# Patient Record
Sex: Male | Born: 1945
Health system: Southern US, Community
[De-identification: ages and names within clinical notes are randomized; demographics above are authoritative.]

## PROBLEM LIST (undated history)

## (undated) DIAGNOSIS — D519 Vitamin B12 deficiency anemia, unspecified: Secondary | ICD-10-CM

## (undated) DIAGNOSIS — Z803 Family history of malignant neoplasm of breast: Secondary | ICD-10-CM

## (undated) DIAGNOSIS — E785 Hyperlipidemia, unspecified: Secondary | ICD-10-CM

## (undated) DIAGNOSIS — H9191 Unspecified hearing loss, right ear: Secondary | ICD-10-CM

## (undated) DIAGNOSIS — I10 Essential (primary) hypertension: Secondary | ICD-10-CM

## (undated) DIAGNOSIS — H9192 Unspecified hearing loss, left ear: Secondary | ICD-10-CM

## (undated) DIAGNOSIS — K5790 Diverticulosis of intestine, part unspecified, without perforation or abscess without bleeding: Secondary | ICD-10-CM

## (undated) HISTORY — DX: Unspecified hearing loss, left ear: H91.92

## (undated) HISTORY — DX: Essential (primary) hypertension: I10

## (undated) HISTORY — PX: COCHLEAR IMPLANT: SUR684

## (undated) HISTORY — DX: Diverticulosis of intestine, part unspecified, without perforation or abscess without bleeding: K57.90

## (undated) HISTORY — PX: COLONOSCOPY: SHX174

## (undated) HISTORY — PX: KIDNEY STONE SURGERY: SHX686

## (undated) HISTORY — DX: Unspecified hearing loss, right ear: H91.91

## (undated) HISTORY — PX: OTHER SURGICAL HISTORY: SHX169

## (undated) HISTORY — DX: Vitamin B12 deficiency anemia, unspecified: D51.9

## (undated) HISTORY — DX: Hyperlipidemia, unspecified: E78.5

## (undated) HISTORY — DX: Family history of malignant neoplasm of breast: Z80.3

---

## 2004-05-12 ENCOUNTER — Encounter: Admission: RE | Admit: 2004-05-12 | Discharge: 2004-05-12 | Payer: Self-pay | Admitting: Internal Medicine

## 2004-10-21 ENCOUNTER — Ambulatory Visit: Payer: Self-pay | Admitting: Internal Medicine

## 2004-10-28 ENCOUNTER — Ambulatory Visit: Payer: Self-pay | Admitting: Internal Medicine

## 2004-11-04 LAB — HM COLONOSCOPY

## 2005-02-10 ENCOUNTER — Ambulatory Visit: Payer: Self-pay | Admitting: Internal Medicine

## 2005-02-17 ENCOUNTER — Ambulatory Visit: Payer: Self-pay | Admitting: Internal Medicine

## 2005-05-28 ENCOUNTER — Ambulatory Visit: Payer: Self-pay | Admitting: Internal Medicine

## 2005-06-04 ENCOUNTER — Ambulatory Visit: Payer: Self-pay | Admitting: Internal Medicine

## 2005-09-01 ENCOUNTER — Ambulatory Visit: Payer: Self-pay | Admitting: Internal Medicine

## 2005-11-26 ENCOUNTER — Ambulatory Visit: Payer: Self-pay | Admitting: Internal Medicine

## 2005-12-03 ENCOUNTER — Ambulatory Visit: Payer: Self-pay | Admitting: Internal Medicine

## 2006-05-27 ENCOUNTER — Ambulatory Visit: Payer: Self-pay | Admitting: Internal Medicine

## 2006-06-15 ENCOUNTER — Ambulatory Visit: Payer: Self-pay | Admitting: Internal Medicine

## 2007-05-03 DIAGNOSIS — E785 Hyperlipidemia, unspecified: Secondary | ICD-10-CM | POA: Insufficient documentation

## 2007-06-27 ENCOUNTER — Ambulatory Visit: Payer: Self-pay | Admitting: Internal Medicine

## 2007-06-27 LAB — CONVERTED CEMR LAB
Alkaline Phosphatase: 71 units/L (ref 39–117)
Basophils Relative: 0.4 % (ref 0.0–1.0)
Calcium: 9.7 mg/dL (ref 8.4–10.5)
Chloride: 107 meq/L (ref 96–112)
Cholesterol: 135 mg/dL (ref 0–200)
Eosinophils Absolute: 0.4 10*3/uL (ref 0.0–0.6)
Eosinophils Relative: 6 % — ABNORMAL HIGH (ref 0.0–5.0)
Glucose, Bld: 99 mg/dL (ref 70–99)
LDL Cholesterol: 79 mg/dL (ref 0–99)
Lymphocytes Relative: 19.2 % (ref 12.0–46.0)
Monocytes Absolute: 0.7 10*3/uL (ref 0.2–0.7)
Monocytes Relative: 9.4 % (ref 3.0–11.0)
Neutro Abs: 5 10*3/uL (ref 1.4–7.7)
Platelets: 210 10*3/uL (ref 150–400)
Potassium: 4.7 meq/L (ref 3.5–5.1)
Protein, U semiquant: NEGATIVE
RDW: 12.5 % (ref 11.5–14.6)
Total Bilirubin: 0.9 mg/dL (ref 0.3–1.2)
Total CHOL/HDL Ratio: 4.6

## 2007-07-20 ENCOUNTER — Ambulatory Visit: Payer: Self-pay | Admitting: Internal Medicine

## 2007-07-20 DIAGNOSIS — K5730 Diverticulosis of large intestine without perforation or abscess without bleeding: Secondary | ICD-10-CM | POA: Insufficient documentation

## 2007-07-20 LAB — CONVERTED CEMR LAB
Cholesterol, target level: 200 mg/dL
LDL Goal: 130 mg/dL

## 2007-07-28 ENCOUNTER — Telehealth: Payer: Self-pay | Admitting: *Deleted

## 2008-01-16 ENCOUNTER — Ambulatory Visit: Payer: Self-pay | Admitting: Internal Medicine

## 2008-01-16 LAB — CONVERTED CEMR LAB
Albumin: 3.9 g/dL (ref 3.5–5.2)
Alkaline Phosphatase: 61 units/L (ref 39–117)
Bilirubin, Direct: 0.1 mg/dL (ref 0.0–0.3)
HDL: 31.8 mg/dL — ABNORMAL LOW (ref >39.0)
LDL Cholesterol: 96 mg/dL (ref 0–99)
Total Bilirubin: 0.9 mg/dL (ref 0.3–1.2)
Total CHOL/HDL Ratio: 4.9
Total Protein: 6.5 g/dL (ref 6.0–8.3)

## 2008-01-25 ENCOUNTER — Ambulatory Visit: Payer: Self-pay | Admitting: Internal Medicine

## 2008-01-25 DIAGNOSIS — I10 Essential (primary) hypertension: Secondary | ICD-10-CM | POA: Insufficient documentation

## 2008-03-28 ENCOUNTER — Ambulatory Visit: Payer: Self-pay | Admitting: Internal Medicine

## 2008-03-28 LAB — CONVERTED CEMR LAB
CRP, High Sensitivity: <1 — ABNORMAL LOW (ref 0.00–5.00)
Calcium: 9.6 mg/dL (ref 8.4–10.5)
Chloride: 106 meq/L (ref 96–112)
Glucose, Bld: 97 mg/dL (ref 70–99)
Potassium: 4.4 meq/L (ref 3.5–5.1)
Sodium: 142 meq/L (ref 135–145)

## 2008-04-26 ENCOUNTER — Ambulatory Visit: Payer: Self-pay | Admitting: Internal Medicine

## 2008-07-25 ENCOUNTER — Ambulatory Visit: Payer: Self-pay | Admitting: Internal Medicine

## 2008-07-25 LAB — CONVERTED CEMR LAB
ALT: 24 units/L (ref 0–53)
Alkaline Phosphatase: 66 units/L (ref 39–117)
BUN: 14 mg/dL (ref 6–23)
Calcium: 9.5 mg/dL (ref 8.4–10.5)
Chloride: 108 meq/L (ref 96–112)
Cholesterol: 130 mg/dL (ref 0–200)
Eosinophils Absolute: 0.6 10*3/uL (ref 0.0–0.7)
Eosinophils Relative: 8.6 % — ABNORMAL HIGH (ref 0.0–5.0)
GFR calc Af Amer: 66 mL/min
GFR calc non Af Amer: 55 mL/min
Glucose, Urine, Semiquant: NEGATIVE
HCT: 39.9 % (ref 39.0–52.0)
HDL: 33.9 mg/dL — ABNORMAL LOW (ref >39.0)
Hemoglobin: 14 g/dL (ref 13.0–17.0)
LDL Cholesterol: 66 mg/dL (ref 0–99)
MCHC: 35.1 g/dL (ref 30.0–36.0)
Monocytes Absolute: 0.4 10*3/uL (ref 0.1–1.0)
Neutro Abs: 4 10*3/uL (ref 1.4–7.7)
Platelets: 204 10*3/uL (ref 150–400)
Specific Gravity, Urine: 1.025
Urobilinogen, UA: 0.2
VLDL: 30 mg/dL (ref 0–40)
WBC Urine, dipstick: NEGATIVE
pH: 5

## 2008-08-01 ENCOUNTER — Ambulatory Visit: Payer: Self-pay | Admitting: Internal Medicine

## 2008-11-01 ENCOUNTER — Ambulatory Visit: Payer: Self-pay | Admitting: Internal Medicine

## 2008-11-01 DIAGNOSIS — G63 Polyneuropathy in diseases classified elsewhere: Secondary | ICD-10-CM

## 2008-11-01 DIAGNOSIS — E538 Deficiency of other specified B group vitamins: Secondary | ICD-10-CM | POA: Insufficient documentation

## 2008-12-04 ENCOUNTER — Telehealth: Payer: Self-pay | Admitting: Internal Medicine

## 2009-07-31 ENCOUNTER — Ambulatory Visit: Payer: Self-pay | Admitting: Internal Medicine

## 2009-07-31 LAB — CONVERTED CEMR LAB
ALT: 26 units/L (ref 0–53)
Albumin: 4 g/dL (ref 3.5–5.2)
Basophils Absolute: 0.1 10*3/uL (ref 0.0–0.1)
Basophils Relative: 0.8 % (ref 0.0–3.0)
Bilirubin Urine: NEGATIVE
Bilirubin, Direct: 0.1 mg/dL (ref 0.0–0.3)
Eosinophils Absolute: 0.4 10*3/uL (ref 0.0–0.7)
Eosinophils Relative: 4 % (ref 0.0–5.0)
GFR calc non Af Amer: 59.37 mL/min (ref >60)
Glucose, Bld: 103 mg/dL — ABNORMAL HIGH (ref 70–99)
Glucose, Urine, Semiquant: NEGATIVE
HDL: 33.3 mg/dL — ABNORMAL LOW (ref >39.00)
Ketones, urine, test strip: NEGATIVE
Lymphocytes Relative: 19.8 % (ref 12.0–46.0)
Lymphs Abs: 1.9 10*3/uL (ref 0.7–4.0)
Neutro Abs: 6.9 10*3/uL (ref 1.4–7.7)
PSA: 0.88 ng/mL (ref 0.10–4.00)
Sodium: 144 meq/L (ref 135–145)
Total Protein: 6.9 g/dL (ref 6.0–8.3)
Triglycerides: 242 mg/dL — ABNORMAL HIGH (ref 0.0–149.0)
Urobilinogen, UA: 0.2
VLDL: 48.4 mg/dL — ABNORMAL HIGH (ref 0.0–40.0)
WBC Urine, dipstick: NEGATIVE
WBC: 9.8 10*3/uL (ref 4.5–10.5)
pH: 5.5

## 2009-08-07 ENCOUNTER — Ambulatory Visit: Payer: Self-pay | Admitting: Internal Medicine

## 2009-09-15 ENCOUNTER — Telehealth: Payer: Self-pay | Admitting: Internal Medicine

## 2009-12-26 ENCOUNTER — Telehealth: Payer: Self-pay | Admitting: Internal Medicine

## 2010-01-29 ENCOUNTER — Ambulatory Visit: Payer: Self-pay | Admitting: Internal Medicine

## 2010-01-29 LAB — CONVERTED CEMR LAB
AST: 19 units/L (ref 0–37)
Bilirubin, Direct: 0.1 mg/dL (ref 0.0–0.3)
Cholesterol: 200 mg/dL (ref 0–200)
Total Bilirubin: 0.5 mg/dL (ref 0.3–1.2)
Triglycerides: 877 mg/dL — ABNORMAL HIGH (ref 0.0–149.0)
VLDL: 175.4 mg/dL — ABNORMAL HIGH (ref 0.0–40.0)

## 2010-02-05 ENCOUNTER — Ambulatory Visit: Payer: Self-pay | Admitting: Internal Medicine

## 2010-03-25 ENCOUNTER — Emergency Department (HOSPITAL_COMMUNITY): Admission: EM | Admit: 2010-03-25 | Discharge: 2010-03-25 | Payer: Self-pay | Admitting: Emergency Medicine

## 2010-04-23 ENCOUNTER — Ambulatory Visit: Payer: Self-pay | Admitting: Internal Medicine

## 2010-04-23 LAB — CONVERTED CEMR LAB
ALT: 22 units/L (ref 0–53)
Albumin: 3.9 g/dL (ref 3.5–5.2)
Bilirubin, Direct: 0.2 mg/dL (ref 0.0–0.3)
HDL: 36.9 mg/dL — ABNORMAL LOW (ref >39.00)
LDL Cholesterol: 69 mg/dL (ref 0–99)
Total CHOL/HDL Ratio: 4

## 2010-04-30 ENCOUNTER — Ambulatory Visit: Payer: Self-pay | Admitting: Internal Medicine

## 2010-04-30 DIAGNOSIS — N200 Calculus of kidney: Secondary | ICD-10-CM | POA: Insufficient documentation

## 2010-05-05 ENCOUNTER — Encounter: Payer: Self-pay | Admitting: Internal Medicine

## 2010-07-16 ENCOUNTER — Encounter: Payer: Self-pay | Admitting: Internal Medicine

## 2010-07-16 ENCOUNTER — Ambulatory Visit (HOSPITAL_BASED_OUTPATIENT_CLINIC_OR_DEPARTMENT_OTHER): Admission: RE | Admit: 2010-07-16 | Discharge: 2010-07-16 | Payer: Self-pay | Admitting: Internal Medicine

## 2010-07-22 ENCOUNTER — Ambulatory Visit: Payer: Self-pay | Admitting: Pulmonary Disease

## 2010-08-05 ENCOUNTER — Ambulatory Visit: Payer: Self-pay | Admitting: Internal Medicine

## 2010-08-05 LAB — CONVERTED CEMR LAB
ALT: 24 units/L (ref 0–53)
AST: 25 units/L (ref 0–37)
Albumin: 4.3 g/dL (ref 3.5–5.2)
Basophils Relative: 0.6 % (ref 0.0–3.0)
Bilirubin Urine: NEGATIVE
Bilirubin, Direct: 0.1 mg/dL (ref 0.0–0.3)
CO2: 26 meq/L (ref 19–32)
Calcium: 9.7 mg/dL (ref 8.4–10.5)
Creatinine, Ser: 1.5 mg/dL (ref 0.4–1.5)
Eosinophils Absolute: 0.6 10*3/uL (ref 0.0–0.7)
GFR calc non Af Amer: 50.17 mL/min (ref >60)
Hemoglobin: 13.8 g/dL (ref 13.0–17.0)
MCV: 87.4 fL (ref 78.0–100.0)
Monocytes Absolute: 0.4 10*3/uL (ref 0.1–1.0)
Monocytes Relative: 5.4 % (ref 3.0–12.0)
Nitrite: NEGATIVE
Platelets: 209 10*3/uL (ref 150.0–400.0)
Potassium: 4.7 meq/L (ref 3.5–5.1)
RBC: 4.62 M/uL (ref 4.22–5.81)
Sodium: 141 meq/L (ref 135–145)
Specific Gravity, Urine: 1.025 (ref 1.000–1.030)
Total Protein, Urine: NEGATIVE mg/dL
Triglycerides: 196 mg/dL — ABNORMAL HIGH (ref 0.0–149.0)
WBC: 7.9 10*3/uL (ref 4.5–10.5)
pH: 5.5 (ref 5.0–8.0)

## 2010-08-12 ENCOUNTER — Ambulatory Visit: Payer: Self-pay | Admitting: Internal Medicine

## 2010-10-21 NOTE — Progress Notes (Signed)
Summary: folbic refill  Phone Note Refill Request Message from:  Fax from Pharmacy on December 26, 2009 3:31 PM  Refills Requested: Medication #1:  FOLBIC 2.5-25-2 MG TABS ONE by mouth DAILY Initial call taken by: Kern Reap CMA Duncan Dull),  December 26, 2009 3:31 PM    Prescriptions: FOLBIC 2.5-25-2 MG TABS (FA-PYRIDOXINE-CYANCOBALAMIN) ONE by mouth DAILY  #90 x 3   Entered by:   Kern Reap CMA (AAMA)   Authorized by:   Stacie Glaze MD   Signed by:   Kern Reap CMA (AAMA) on 12/26/2009   Method used:   Electronically to        [facility]* (mail-order)             ,          Ph: [phone]       Fax: [phone]   RxID:   [id number]

## 2010-10-21 NOTE — Letter (Signed)
Summary: Eye Exam/[facility]  [facility]   Imported By: Maryln Gottron 05/14/2010 09:44:09  _____________________________________________________________________  External Attachment:    Type:   Image     Comment:   External Document

## 2010-10-21 NOTE — Assessment & Plan Note (Signed)
Summary: 2 month fup//ccm/pt rsc/cjr/pt rsc/cjr   Vital Signs:  Patient profile:   65 year old male Height:      69 inches Weight:      152 pounds BMI:     22.53 Temp:     98.2 degrees F oral Pulse rate:   68 / minute Resp:     14 per minute BP sitting:   124 / 74  (left arm)  Vitals Entered By: Willy Eddy, LPN (April 30, 2010 12:44 PM) CC: roa labs, Lipid Management, Hypertension Management Is Patient Diabetic? No   Primary Care Osamu Olguin:  Darryll Capers MD  CC:  roa labs, Lipid Management, and Hypertension Management.  History of Present Illness: follw up of lipids recent hx of colonoscopy and hx of rencent impacted renal stone with removal   Hypertension History:      He denies headache, chest pain, palpitations, dyspnea with exertion, orthopnea, PND, peripheral edema, visual symptoms, neurologic problems, syncope, and side effects from treatment.        Positive major cardiovascular risk factors include male age 20 years old or older, hyperlipidemia, and hypertension.  Negative major cardiovascular risk factors include no history of diabetes, negative family history for ischemic heart disease, and non-tobacco-user status.        Further assessment for target organ damage reveals no history of ASHD, stroke/TIA, or peripheral vascular disease.    Lipid Management History:      Positive NCEP/ATP III risk factors include male age 11 years old or older, HDL cholesterol less than 40, and hypertension.  Negative NCEP/ATP III risk factors include non-diabetic, no family history for ischemic heart disease, non-tobacco-user status, no ASHD (atherosclerotic heart disease), no prior stroke/TIA, no peripheral vascular disease, and no history of aortic aneurysm.      Preventive Screening-Counseling & Management  Alcohol-Tobacco     Alcohol drinks/day: <1     Smoking Status: never     Passive Smoke Exposure: no  Current Problems (verified): 1)  Acute Bronchitis   (ICD-466.0) 2)  Idiopathic Peripheral Autonomic Neuropathy Unsp  (ICD-337.00) 3)  Hypertension  (ICD-401.9) 4)  Well Adult  (ICD-V70.0) 5)  Diverticulosis, Colon  (ICD-562.10) 6)  Hyperlipidemia  (ICD-272.4) 7)  Family History Diabetes 1st Degree Relative  (ICD-V18.0) 8)  Family History Breast Cancer 1st Degree Relative <50  (ICD-V16.3)  Current Medications (verified): 1)  Adult Aspirin Low Strength 81 Mg  Tbdp (Aspirin) .... Once Daily 2)  Zyrtec Allergy 10 Mg  Tabs (Cetirizine Hcl) .... Once Daily 3)  Vytorin 10-40 Mg Tabs (Ezetimibe-Simvastatin) 4)  Losartan Potassium 100 Mg Tabs (Losartan Potassium) .... One By Mouth Daily 5)  Folbic 2.5-25-2 Mg Tabs (Fa-Pyridoxine-Cyancobalamin) .... One By Mouth Daily 6)  Niacin Flush Free 500 Mg Caps (Inositol Niacinate) .... One By Mouth Daily 7)  Vitamin D 3 1000 .Marland Kitchen.. 1 Once Daily  Allergies (verified): No Known Drug Allergies  Past History:  Family History: Last updated: 03/28/2008 Family History of Emphysema Family History Breast cancer 1st degree relative <50 Family History Diabetes 1st degree relative Family History of Cardiovascular disorder Family History Hypertension  Social History: Last updated: 05/03/2007 Occupation: Married Never Smoked Alcohol use-yes Drug use-no  Risk Factors: Alcohol Use: <1 (04/30/2010) Exercise: yes (07/20/2007)  Risk Factors: Smoking Status: never (04/30/2010) Passive Smoke Exposure: no (04/30/2010)  Past medical, surgical, family and social histories (including risk factors) reviewed, and no changes noted (except as noted below).  Past Medical History: Reviewed history from 11/01/2008 and no changes  required. Urolithiasis Hyperlipidemia Diverticulosis, colon right hearing loss Hypertension b12 anemia  Past Surgical History: Reviewed history from 05/03/2007 and no changes required. Colonoscopy  Family History: Reviewed history from 03/28/2008 and no changes required. Family  History of Emphysema Family History Breast cancer 1st degree relative <50 Family History Diabetes 1st degree relative Family History of Cardiovascular disorder Family History Hypertension  Social History: Reviewed history from 05/03/2007 and no changes required. Occupation: Married Never Smoked Alcohol use-yes Drug use-no  Review of Systems  The patient denies anorexia, fever, weight loss, weight gain, vision loss, decreased hearing, hoarseness, chest pain, syncope, dyspnea on exertion, peripheral edema, prolonged cough, headaches, hemoptysis, abdominal pain, melena, hematochezia, severe indigestion/heartburn, hematuria, incontinence, genital sores, muscle weakness, suspicious skin lesions, transient blindness, difficulty walking, depression, unusual weight change, abnormal bleeding, enlarged lymph nodes, angioedema, and breast masses.     Impression & Recommendations:  Problem # 1:  DIVERTICULOSIS, COLON (ICD-562.10) Assessment Unchanged  had colon on June 30  Charm Barges) results pending had pain afterward  Colonoscopy:  Labs Reviewed: Hgb: 14.1 (07/31/2009)   Hct: 41.3 (07/31/2009)   WBC: 9.8 (07/31/2009)  Problem # 2:  NEPHROLITHIASIS, RECURRENT (ICD-592.0) Assessment: Unchanged had a stone ( possible dehadration after the colonoscopy and the heat) had surgical removal [place] at [facility] increased fluids treated for infection  Problem # 3:  HYPERTENSION (ICD-401.9)  His updated medication list for this problem includes:    Losartan Potassium 100 Mg Tabs (Losartan potassium) ..... One by mouth daily  BP today: 124/74 Prior BP: 116/78 (02/05/2010)  10 Yr Risk Heart Disease: 7 % Prior 10 Yr Risk Heart Disease: 9 % (02/05/2010)  Labs Reviewed: K+: 4.2 (07/31/2009) Creat: : 1.3 (07/31/2009)   Chol: 142 (04/23/2010)   HDL: 36.90 (04/23/2010)   LDL: 69 (04/23/2010)   TG: 182.0 (04/23/2010)  Problem # 4:  HYPERLIPIDEMIA (ICD-272.4) stable results ans inproved  triglyceride levels  His updated medication list for this problem includes:    Vytorin 10-40 Mg Tabs (Ezetimibe-simvastatin) ..... One by mouth daily  Labs Reviewed: SGOT: 21 (04/23/2010)   SGPT: 22 (04/23/2010)  Lipid Goals: Chol Goal: 200 (07/20/2007)   HDL Goal: 40 (07/20/2007)   LDL Goal: 130 (07/20/2007)   TG Goal: 150 (07/20/2007)  10 Yr Risk Heart Disease: 7 % Prior 10 Yr Risk Heart Disease: 9 % (02/05/2010)   HDL:36.90 (04/23/2010), 34.40 (01/29/2010)  LDL:69 (04/23/2010), 66 (07/25/2008)  Chol:142 (04/23/2010), 200 (01/29/2010)  Trig:182.0 (04/23/2010), 877.0 (01/29/2010)  Complete Medication List: 1)  Adult Aspirin Low Strength 81 Mg Tbdp (Aspirin) .... Once daily 2)  Zyrtec Allergy 10 Mg Tabs (Cetirizine hcl) .... Once daily 3)  Vytorin 10-40 Mg Tabs (Ezetimibe-simvastatin) .... One by mouth daily 4)  Losartan Potassium 100 Mg Tabs (Losartan potassium) .... One by mouth daily 5)  Folbic 2.5-25-2 Mg Tabs (Fa-pyridoxine-cyancobalamin) .... One by mouth daily 6)  Niacin Flush Free 500 Mg Caps (Inositol niacinate) .... One by mouth daily 7)  Vitamin D 3 1000  .Marland Kitchen.. 1 once daily  Hypertension Assessment/Plan:      The patient's hypertensive risk group is category B: At least one risk factor (excluding diabetes) with no target organ damage.  His calculated 10 year risk of coronary heart disease is 7 %.  Today's blood pressure is 124/74.  His blood pressure goal is < 140/90.  Lipid Assessment/Plan:      Based on NCEP/ATP III, the patient's risk factor category is "2 or more risk factors and a calculated 10 year CAD risk  of < 20%".  The patient's lipid goals are as follows: Total cholesterol goal is 200; LDL cholesterol goal is 130; HDL cholesterol goal is 40; Triglyceride goal is 150.  His LDL cholesterol goal has been met.    Patient Instructions: 1)  Please schedule a follow-up appointment in 2months. CPX Prescriptions: VYTORIN 10-40 MG TABS (EZETIMIBE-SIMVASTATIN) one by mouth  daily  #90 x 3   Entered and Authorized by:   Stacie Glaze MD   Signed by:   Stacie Glaze MD on 04/30/2010   Method used:   Electronically to        [facility]* (retail)             ,          Ph: [phone]       Fax: [phone]   RxID:   [id number]   Appended Document: Orders Update    Clinical Lists Changes  Problems: Added new problem of SLEEP APNEA (ICD-780.57) Orders: Added new Referral order of Sleep Disorder Referral (Sleep Disorder) - Signed

## 2010-10-21 NOTE — Assessment & Plan Note (Signed)
Summary: cpx/njr   Vital Signs:  Patient profile:   65 year old male Height:      69 inches Weight:      156 pounds BMI:     23.12 Temp:     98.2 degrees F oral Pulse rate:   68 / minute Resp:     14 per minute BP sitting:   118 / 72  (left arm)  Vitals Entered By: Willy Eddy, LPN (August 12, 2010 11:04 AM) CC: cpx, Hypertension Management, Lipid Management Is Patient Diabetic? No   Primary Care Arvid Marengo:  Darryll Capers MD  CC:  cpx, Hypertension Management, and Lipid Management.  History of Present Illness: The pt was asked about all immunizations, health maint. services that are appropriate to their age and was given guidance on diet exercize  and weight management  the pt has HTN well controlled and lipids with elevated triglycerides  Hypertension History:      He denies headache, chest pain, palpitations, dyspnea with exertion, orthopnea, PND, peripheral edema, visual symptoms, neurologic problems, syncope, and side effects from treatment.        Positive major cardiovascular risk factors include male age 72 years old or older, hyperlipidemia, and hypertension.  Negative major cardiovascular risk factors include no history of diabetes, negative family history for ischemic heart disease, and non-tobacco-user status.        Further assessment for target organ damage reveals no history of ASHD, stroke/TIA, or peripheral vascular disease.    Lipid Management History:      Positive NCEP/ATP III risk factors include male age 18 years old or older, HDL cholesterol less than 40, and hypertension.  Negative NCEP/ATP III risk factors include non-diabetic, no family history for ischemic heart disease, non-tobacco-user status, no ASHD (atherosclerotic heart disease), no prior stroke/TIA, no peripheral vascular disease, and no history of aortic aneurysm.      Preventive Screening-Counseling & Management  Alcohol-Tobacco     Alcohol drinks/day: <1     Smoking Status: never   Passive Smoke Exposure: no     Tobacco Counseling: not indicated; no tobacco use  Problems Prior to Update: 1)  Sleep Apnea  (ICD-780.57) 2)  Nephrolithiasis, Recurrent  (ICD-592.0) 3)  Acute Bronchitis  (ICD-466.0) 4)  Idiopathic Peripheral Autonomic Neuropathy Unsp  (ICD-337.00) 5)  Hypertension  (ICD-401.9) 6)  Well Adult  (ICD-V70.0) 7)  Diverticulosis, Colon  (ICD-562.10) 8)  Hyperlipidemia  (ICD-272.4) 9)  Family History Diabetes 1st Degree Relative  (ICD-V18.0) 10)  Family History Breast Cancer 1st Degree Relative <50  (ICD-V16.3)  Current Problems (verified): 1)  Sleep Apnea  (ICD-780.57) 2)  Nephrolithiasis, Recurrent  (ICD-592.0) 3)  Acute Bronchitis  (ICD-466.0) 4)  Idiopathic Peripheral Autonomic Neuropathy Unsp  (ICD-337.00) 5)  Hypertension  (ICD-401.9) 6)  Well Adult  (ICD-V70.0) 7)  Diverticulosis, Colon  (ICD-562.10) 8)  Hyperlipidemia  (ICD-272.4) 9)  Family History Diabetes 1st Degree Relative  (ICD-V18.0) 10)  Family History Breast Cancer 1st Degree Relative <50  (ICD-V16.3)  Medications Prior to Update: 1)  Adult Aspirin Low Strength 81 Mg  Tbdp (Aspirin) .... Once Daily 2)  Zyrtec Allergy 10 Mg  Tabs (Cetirizine Hcl) .... Once Daily 3)  Vytorin 10-40 Mg Tabs (Ezetimibe-Simvastatin) .... One By Mouth Daily 4)  Losartan Potassium 100 Mg Tabs (Losartan Potassium) .... One By Mouth Daily 5)  Folbic 2.5-25-2 Mg Tabs (Fa-Pyridoxine-Cyancobalamin) .... One By Mouth Daily 6)  Niacin Flush Free 500 Mg Caps (Inositol Niacinate) .... One By Mouth Daily 7)  Vitamin D 3 1000 .Marland Kitchen.. 1 Once Daily  Current Medications (verified): 1)  Adult Aspirin Low Strength 81 Mg  Tbdp (Aspirin) .... Once Daily 2)  Zyrtec Allergy 10 Mg  Tabs (Cetirizine Hcl) .... Once Daily 3)  Vytorin 10-40 Mg Tabs (Ezetimibe-Simvastatin) .... One By Mouth Daily 4)  Losartan Potassium 100 Mg Tabs (Losartan Potassium) .... One By Mouth Daily 5)  Folbic 2.5-25-2 Mg Tabs (Fa-Pyridoxine-Cyancobalamin)  .... One By Mouth Daily 6)  Niacin Flush Free 500 Mg Caps (Inositol Niacinate) .... One By Mouth Daily 7)  Vitamin D 3 1000 .Marland Kitchen.. 1 Once Daily  Allergies (verified): No Known Drug Allergies  Past History:  Family History: Last updated: 03/28/2008 Family History of Emphysema Family History Breast cancer 1st degree relative <50 Family History Diabetes 1st degree relative Family History of Cardiovascular disorder Family History Hypertension  Social History: Last updated: 05/03/2007 Occupation: Married Never Smoked Alcohol use-yes Drug use-no  Risk Factors: Alcohol Use: <1 (08/12/2010) Exercise: yes (07/20/2007)  Risk Factors: Smoking Status: never (08/12/2010) Passive Smoke Exposure: no (08/12/2010)  Past medical, surgical, family and social histories (including risk factors) reviewed, and no changes noted (except as noted below).  Past Medical History: Reviewed history from 11/01/2008 and no changes required. Urolithiasis Hyperlipidemia Diverticulosis, colon right hearing loss Hypertension b12 anemia  Past Surgical History: Reviewed history from 05/03/2007 and no changes required. Colonoscopy  Family History: Reviewed history from 03/28/2008 and no changes required. Family History of Emphysema Family History Breast cancer 1st degree relative <50 Family History Diabetes 1st degree relative Family History of Cardiovascular disorder Family History Hypertension  Social History: Reviewed history from 05/03/2007 and no changes required. Occupation: Married Never Smoked Alcohol use-yes Drug use-no  Review of Systems  The patient denies anorexia, fever, weight loss, weight gain, vision loss, decreased hearing, hoarseness, chest pain, syncope, dyspnea on exertion, peripheral edema, prolonged cough, headaches, hemoptysis, abdominal pain, melena, hematochezia, severe indigestion/heartburn, hematuria, incontinence, genital sores, muscle weakness, suspicious skin  lesions, transient blindness, difficulty walking, depression, unusual weight change, abnormal bleeding, enlarged lymph nodes, angioedema, and breast masses.    Physical Exam  General:  alert and overweight-appearing.   Head:  normocephalic and no abnormalities observed.   Eyes:  pupils equal, pupils round, and pupils reactive to light.   Ears:  R ear normal and L ear normal.   Nose:  no external deformity and no nasal discharge.   Mouth:  Oral mucosa and oropharynx without lesions or exudates.  Teeth in good repair.gingival inflammation.   Neck:  No deformities, masses, or tenderness noted. Lungs:  normal respiratory effort and no wheezes.   Heart:  normal rate and regular rhythm.   Abdomen:  soft and non-tender.   Msk:  normal ROM and no joint tenderness.   Pulses:  R and L carotid,radial,femoral,dorsalis pedis and posterior tibial pulses are full and equal bilaterally Extremities:  No clubbing, cyanosis, edema, or deformity noted with normal full range of motion of all joints.     Impression & Recommendations:  Problem # 1:  WELL ADULT (ICD-V70.0) Assessment Unchanged The pt was asked about all immunizations, health maint. services that are appropriate to their age and was given guidance on diet exercize  and weight management  Colonoscopy: normal (11/04/2004) Td Booster: Historical (09/20/2001)   Flu Vax: Historical (07/21/2010)   Chol: 129 (08/05/2010)   HDL: 36.60 (08/05/2010)   LDL: 53 (08/05/2010)   TG: 196.0 (08/05/2010) TSH: 4.52 (08/05/2010)   PSA: 0.95 (08/05/2010) Next Colonoscopy due:: 10/2009 (08/07/2009)  Discussed using sunscreen, use of alcohol, drug use, self testicular exam, routine dental care, routine eye care, routine physical exam, seat belts, multiple vitamins, osteoporosis prevention, adequate calcium intake in diet, and recommendations for immunizations.  Discussed exercise and checking cholesterol.  Discussed gun safety, safe sex, and contraception. Also  recommend checking PSA.  Problem # 2:  HYPERTENSION (ICD-401.9) Assessment: Improved  His updated medication list for this problem includes:    Losartan Potassium 100 Mg Tabs (Losartan potassium) ..... One by mouth daily  BP today: 118/72 Prior BP: 124/74 (04/30/2010)  Prior 10 Yr Risk Heart Disease: 7 % (04/30/2010)  Labs Reviewed: K+: 4.7 (08/05/2010) Creat: : 1.5 (08/05/2010)   Chol: 129 (08/05/2010)   HDL: 36.60 (08/05/2010)   LDL: 53 (08/05/2010)   TG: 196.0 (08/05/2010)  Problem # 3:  HYPERLIPIDEMIA (ICD-272.4)  he recognized diet and weigth issues as well as the need to be 1005 complinet with dosing His updated medication list for this problem includes:    Vytorin 10-40 Mg Tabs (Ezetimibe-simvastatin) ..... One by mouth daily  Labs Reviewed: SGOT: 25 (08/05/2010)   SGPT: 24 (08/05/2010)  Lipid Goals: Chol Goal: 200 (07/20/2007)   HDL Goal: 40 (07/20/2007)   LDL Goal: 130 (07/20/2007)   TG Goal: 150 (07/20/2007)  Prior 10 Yr Risk Heart Disease: 7 % (04/30/2010)   HDL:36.60 (08/05/2010), 36.90 (04/23/2010)  LDL:53 (08/05/2010), 69 (04/23/2010)  Chol:129 (08/05/2010), 142 (04/23/2010)  Trig:196.0 (08/05/2010), 182.0 (04/23/2010)  Complete Medication List: 1)  Adult Aspirin Low Strength 81 Mg Tbdp (Aspirin) .... Once daily 2)  Zyrtec Allergy 10 Mg Tabs (Cetirizine hcl) .... Once daily 3)  Vytorin 10-40 Mg Tabs (Ezetimibe-simvastatin) .... One by mouth daily 4)  Losartan Potassium 100 Mg Tabs (Losartan potassium) .... One by mouth daily 5)  Folbic 2.5-25-2 Mg Tabs (Fa-pyridoxine-cyancobalamin) .... One by mouth daily 6)  Niacin Flush Free 500 Mg Caps (Inositol niacinate) .... One by mouth daily 7)  Vitamin D 3 1000  .Marland Kitchen.. 1 once daily  Hypertension Assessment/Plan:      The patient's hypertensive risk group is category B: At least one risk factor (excluding diabetes) with no target organ damage.  His calculated 10 year risk of coronary heart disease is 7 %.  Today's blood  pressure is 118/72.  His blood pressure goal is < 140/90.  Lipid Assessment/Plan:      Based on NCEP/ATP III, the patient's risk factor category is "2 or more risk factors and a calculated 10 year CAD risk of < 20%".  The patient's lipid goals are as follows: Total cholesterol goal is 200; LDL cholesterol goal is 130; HDL cholesterol goal is 40; Triglyceride goal is 150.  His LDL cholesterol goal has been met.    Patient Instructions: 1)  Please schedule a follow-up appointment in 6 months. 2)  Hepatic Panel prior to visit, ICD-9:995.20 3)  Lipid Panel prior to visit, ICD-9:272.4   Orders Added: 1)  Est. Patient 40-64 years [99396] 2)  Est. Patient Level II [02725]   Immunization History:  Influenza Immunization History:    Influenza:  historical (07/21/2010)   Immunization History:  Influenza Immunization History:    Influenza:  Historical (07/21/2010)

## 2010-10-21 NOTE — Assessment & Plan Note (Signed)
Summary: 6 MNTH ROV & REVIEW LABS//[facility]   Vital Signs:  Patient profile:   65 year old male Height:      69 inches Weight:      156 pounds BMI:     23.12 Temp:     98.3 degrees F oral Pulse rate:   72 / minute Resp:     14 per minute BP sitting:   116 / 78  (left arm)  Vitals Entered By: Willy Eddy, LPN (Feb 05, 2010 9:01 AM) CC: roa labs, Hypertension Management, Lipid Management   Primary Care Provider:  Darryll Capers MD  CC:  roa labs, Hypertension Management, and Lipid Management.  History of Present Illness:  Follow-Up Visit      This is a 65 year old man who presents for Follow-up visit.  The patient denies chest pain, palpitations, dizziness, syncope, low blood sugar symptoms, high blood sugar symptoms, edema, SOB, DOE, PND, and orthopnea.  Since the last visit the patient notes no new problems or concerns.  The patient reports taking meds as prescribed and monitoring BP.  When questioned about possible medication side effects, the patient notes none.    Hypertension History:      He denies headache, chest pain, palpitations, dyspnea with exertion, orthopnea, PND, peripheral edema, visual symptoms, neurologic problems, syncope, and side effects from treatment.        Positive major cardiovascular risk factors include male age 64 years old or older, hyperlipidemia, and hypertension.  Negative major cardiovascular risk factors include no history of diabetes, negative family history for ischemic heart disease, and non-tobacco-user status.        Further assessment for target organ damage reveals no history of ASHD, stroke/TIA, or peripheral vascular disease.    Lipid Management History:      Positive NCEP/ATP III risk factors include male age 64 years old or older, HDL cholesterol less than 40, and hypertension.  Negative NCEP/ATP III risk factors include non-diabetic, no family history for ischemic heart disease, non-tobacco-user status, no ASHD (atherosclerotic heart  disease), no prior stroke/TIA, no peripheral vascular disease, and no history of aortic aneurysm.      Preventive Screening-Counseling & Management  Alcohol-Tobacco     Alcohol drinks/day: <1     Smoking Status: never     Passive Smoke Exposure: no  Current Problems (verified): 1)  Acute Bronchitis  (ICD-466.0) 2)  Idiopathic Peripheral Autonomic Neuropathy Unsp  (ICD-337.00) 3)  Hypertension  (ICD-401.9) 4)  Well Adult  (ICD-V70.0) 5)  Diverticulosis, Colon  (ICD-562.10) 6)  Hyperlipidemia  (ICD-272.4) 7)  Family History Diabetes 1st Degree Relative  (ICD-V18.0) 8)  Family History Breast Cancer 1st Degree Relative <50  (ICD-V16.3)  Current Medications (verified): 1)  Adult Aspirin Low Strength 81 Mg  Tbdp (Aspirin) .... Once Daily 2)  Zyrtec Allergy 10 Mg  Tabs (Cetirizine Hcl) .... Once Daily 3)  Nasacort Aq 55 Mcg/act  Aers (Triamcinolone Acetonide(Nasal)) .... As Needed 4)  Vytorin 10-40 Mg Tabs (Ezetimibe-Simvastatin) 5)  Losartan Potassium 100 Mg Tabs (Losartan Potassium) .... One By Mouth Daily 6)  Folbic 2.5-25-2 Mg Tabs (Fa-Pyridoxine-Cyancobalamin) .... One By Mouth Daily 7)  Niacin Flush Free 500 Mg Caps (Inositol Niacinate) .... One By Mouth Daily 8)  Vitamin D 3 1000 .Marland Kitchen.. 1 Once Daily  Allergies (verified): No Known Drug Allergies  Past History:  Family History: Last updated: 03/28/2008 Family History of Emphysema Family History Breast cancer 1st degree relative <50 Family History Diabetes 1st degree relative Family History  of Cardiovascular disorder Family History Hypertension  Social History: Last updated: 05/03/2007 Occupation: Married Never Smoked Alcohol use-yes Drug use-no  Risk Factors: Alcohol Use: <1 (02/05/2010) Exercise: yes (07/20/2007)  Risk Factors: Smoking Status: never (02/05/2010) Passive Smoke Exposure: no (02/05/2010)  Past medical, surgical, family and social histories (including risk factors) reviewed, and no changes noted  (except as noted below).  Past Medical History: Reviewed history from 11/01/2008 and no changes required. Urolithiasis Hyperlipidemia Diverticulosis, colon right hearing loss Hypertension b12 anemia  Past Surgical History: Reviewed history from 05/03/2007 and no changes required. Colonoscopy  Family History: Reviewed history from 03/28/2008 and no changes required. Family History of Emphysema Family History Breast cancer 1st degree relative <50 Family History Diabetes 1st degree relative Family History of Cardiovascular disorder Family History Hypertension  Social History: Reviewed history from 05/03/2007 and no changes required. Occupation: Married Never Smoked Alcohol use-yes Drug use-no  Review of Systems  The patient denies anorexia, fever, weight loss, weight gain, vision loss, decreased hearing, hoarseness, chest pain, syncope, dyspnea on exertion, peripheral edema, prolonged cough, headaches, hemoptysis, abdominal pain, melena, hematochezia, severe indigestion/heartburn, hematuria, incontinence, genital sores, muscle weakness, suspicious skin lesions, transient blindness, difficulty walking, depression, unusual weight change, abnormal bleeding, enlarged lymph nodes, angioedema, breast masses, and testicular masses.    Physical Exam  General:  alert and overweight-appearing.   Head:  normocephalic and no abnormalities observed.   Eyes:  pupils equal, pupils round, and pupils reactive to light.   Ears:  R ear normal and L ear normal.   Nose:  no external deformity and no nasal discharge.   Neck:  No deformities, masses, or tenderness noted. Lungs:  normal respiratory effort and no wheezes.   Heart:  normal rate and regular rhythm.   Abdomen:  soft and non-tender.   Msk:  normal ROM and no joint tenderness.     Impression & Recommendations:  Problem # 1:  HYPERTENSION (ICD-401.9)  His updated medication list for this problem includes:    Losartan Potassium  100 Mg Tabs (Losartan potassium) ..... One by mouth daily  BP today: 116/78 Prior BP: 138/74 (08/07/2009)  10 Yr Risk Heart Disease: 9 % Prior 10 Yr Risk Heart Disease: 11 % (11/01/2008)  Labs Reviewed: K+: 4.2 (07/31/2009) Creat: : 1.3 (07/31/2009)   Chol: 200 (01/29/2010)   HDL: 34.40 (01/29/2010)   LDL: 66 (07/25/2008)   TG: 877.0 (01/29/2010)  Problem # 2:  HYPERLIPIDEMIA (ICD-272.4) Assessment: Improved  His updated medication list for this problem includes:    Vytorin 10-40 Mg Tabs (Ezetimibe-simvastatin)  Labs Reviewed: SGOT: 19 (01/29/2010)   SGPT: 19 (01/29/2010)  Lipid Goals: Chol Goal: 200 (07/20/2007)   HDL Goal: 40 (07/20/2007)   LDL Goal: 130 (07/20/2007)   TG Goal: 150 (07/20/2007)  10 Yr Risk Heart Disease: 9 % Prior 10 Yr Risk Heart Disease: 11 % (11/01/2008)   HDL:34.40 (01/29/2010), 33.30 (07/31/2009)  LDL:66 (07/25/2008), 96 (01/16/2008)  Chol:200 (01/29/2010), 137 (07/31/2009)  Trig:877.0 (01/29/2010), 242.0 (07/31/2009)  Problem # 3:  IDIOPATHIC PERIPHERAL AUTONOMIC NEUROPATHY UNSP (ICD-337.00) 0n b12  Complete Medication List: 1)  Adult Aspirin Low Strength 81 Mg Tbdp (Aspirin) .... Once daily 2)  Zyrtec Allergy 10 Mg Tabs (Cetirizine hcl) .... Once daily 3)  Nasacort Aq 55 Mcg/act Aers (Triamcinolone acetonide(nasal)) .... As needed 4)  Vytorin 10-40 Mg Tabs (Ezetimibe-simvastatin) 5)  Losartan Potassium 100 Mg Tabs (Losartan potassium) .... One by mouth daily 6)  Folbic 2.5-25-2 Mg Tabs (Fa-pyridoxine-cyancobalamin) .... One by mouth daily 7)  Niacin Flush Free 500 Mg Caps (Inositol niacinate) .... One by mouth daily 8)  Vitamin D 3 1000  .Marland Kitchen.. 1 once daily  Hypertension Assessment/Plan:      The patient's hypertensive risk group is category B: At least one risk factor (excluding diabetes) with no target organ damage.  His calculated 10 year risk of coronary heart disease is 9 %.  Today's blood pressure is 116/78.  His blood pressure goal is <  140/90.  Lipid Assessment/Plan:      Based on NCEP/ATP III, the patient's risk factor category is "2 or more risk factors and a calculated 10 year CAD risk of < 20%".  The patient's lipid goals are as follows: Total cholesterol goal is 200; LDL cholesterol goal is 130; HDL cholesterol goal is 40; Triglyceride goal is 150.  His LDL cholesterol goal has been met.    Patient Instructions: 1)  Please schedule a follow-up appointment in 2 months. 2)  Hepatic Panel prior to visit, ICD-9:995.50 3)  Lipid Panel prior to visit, ICD-9:272.4

## 2010-12-06 LAB — URINE MICROSCOPIC-ADD ON

## 2010-12-06 LAB — DIFFERENTIAL
Neutro Abs: 12.3 10*3/uL — ABNORMAL HIGH (ref 1.7–7.7)
Neutrophils Relative %: 90 % — ABNORMAL HIGH (ref 43–77)

## 2010-12-06 LAB — URINALYSIS, ROUTINE W REFLEX MICROSCOPIC
Ketones, ur: NEGATIVE mg/dL
Specific Gravity, Urine: 1.02 (ref 1.005–1.030)
pH: 5 (ref 5.0–8.0)

## 2010-12-06 LAB — COMPREHENSIVE METABOLIC PANEL
Albumin: 4.2 g/dL (ref 3.5–5.2)
Alkaline Phosphatase: 72 U/L (ref 39–117)
Chloride: 101 mEq/L (ref 96–112)
Potassium: 3.8 mEq/L (ref 3.5–5.1)
Total Bilirubin: 1.4 mg/dL — ABNORMAL HIGH (ref 0.3–1.2)
Total Protein: 7.1 g/dL (ref 6.0–8.3)

## 2010-12-06 LAB — LIPASE, BLOOD: Lipase: 24 U/L (ref 11–59)

## 2010-12-06 LAB — CBC
HCT: 44.3 % (ref 39.0–52.0)
MCHC: 33.8 g/dL (ref 30.0–36.0)
RDW: 13.5 % (ref 11.5–15.5)
WBC: 13.7 10*3/uL — ABNORMAL HIGH (ref 4.0–10.5)

## 2011-01-01 ENCOUNTER — Other Ambulatory Visit: Payer: Self-pay | Admitting: *Deleted

## 2011-01-01 MED ORDER — FA-PYRIDOXINE-CYANOCOBALAMIN 2.5-25-2 MG PO TABS: 1.0000 | ORAL_TABLET | Freq: Every day | ORAL | Status: DC

## 2011-02-01 ENCOUNTER — Other Ambulatory Visit (INDEPENDENT_AMBULATORY_CARE_PROVIDER_SITE_OTHER): Payer: BC Managed Care – PPO

## 2011-02-01 ENCOUNTER — Other Ambulatory Visit: Payer: Self-pay | Admitting: Internal Medicine

## 2011-02-01 DIAGNOSIS — E785 Hyperlipidemia, unspecified: Secondary | ICD-10-CM

## 2011-02-01 DIAGNOSIS — T887XXA Unspecified adverse effect of drug or medicament, initial encounter: Secondary | ICD-10-CM

## 2011-02-01 LAB — LIPID PANEL
Cholesterol: 148 mg/dL (ref 0–200)
HDL: 46.5 mg/dL (ref >39.00)
LDL Cholesterol: 83 mg/dL (ref 0–99)
Triglycerides: 92 mg/dL (ref 0.0–149.0)

## 2011-02-01 LAB — HEPATIC FUNCTION PANEL
ALT: 23 U/L (ref 0–53)
Albumin: 3.9 g/dL (ref 3.5–5.2)
Bilirubin, Direct: 0.1 mg/dL (ref 0.0–0.3)
Total Protein: 6.6 g/dL (ref 6.0–8.3)

## 2011-02-02 ENCOUNTER — Encounter: Payer: Self-pay | Admitting: Internal Medicine

## 2011-02-08 ENCOUNTER — Ambulatory Visit (INDEPENDENT_AMBULATORY_CARE_PROVIDER_SITE_OTHER): Payer: BC Managed Care – PPO | Admitting: Internal Medicine

## 2011-02-08 VITALS — BP 120/80 | HR 80 | Temp 98.2°F | Resp 14 | Ht 68.5 in | Wt 154.0 lb

## 2011-02-08 DIAGNOSIS — J309 Allergic rhinitis, unspecified: Secondary | ICD-10-CM

## 2011-02-08 DIAGNOSIS — K5730 Diverticulosis of large intestine without perforation or abscess without bleeding: Secondary | ICD-10-CM

## 2011-02-08 DIAGNOSIS — I10 Essential (primary) hypertension: Secondary | ICD-10-CM

## 2011-02-08 DIAGNOSIS — J302 Other seasonal allergic rhinitis: Secondary | ICD-10-CM

## 2011-02-08 DIAGNOSIS — E785 Hyperlipidemia, unspecified: Secondary | ICD-10-CM

## 2011-02-08 MED ORDER — EZETIMIBE-SIMVASTATIN 10-40 MG PO TABS: 1.0000 | ORAL_TABLET | Freq: Every day | ORAL | Status: DC

## 2011-02-18 ENCOUNTER — Encounter: Payer: Self-pay | Admitting: Internal Medicine

## 2011-02-18 NOTE — Progress Notes (Signed)
  Subjective:    Patient ID: Bryan Carson, male    DOB: 1946/04/06, 65 y.o.   MRN: [id number]  HPI Patient presents for followup of hypertension hyperlipidemia a chief complaint of worsening allergic rhinitis today.  He states that due to the increased pollen he has had increased runny nose congestion denies any fever chills or signs of a bacterial infection.  He is compliant with his blood pressure medication He's been on Vytorin and niacin for his lipid control he denies any myalgias  Review of Systems  Constitutional: Negative for fever and fatigue.  HENT: Negative for hearing loss, congestion, neck pain and postnasal drip.   Eyes: Negative for discharge, redness and visual disturbance.  Respiratory: Negative for cough, shortness of breath and wheezing.   Cardiovascular: Negative for leg swelling.  Gastrointestinal: Negative for abdominal pain, constipation and abdominal distention.  Genitourinary: Negative for urgency and frequency.  Musculoskeletal: Negative for joint swelling and arthralgias.  Skin: Negative for color change and rash.  Neurological: Negative for weakness and light-headedness.  Hematological: Negative for adenopathy.  Psychiatric/Behavioral: Negative for behavioral problems.   Past Medical History  Diagnosis Date  . Hyperlipidemia   . Diverticulosis   . Hearing loss of right ear   . Hypertension   . B12 deficiency anemia    Past Surgical History  Procedure Date  . Kidney stone surgery   . Colonoscopy     reports that he has never smoked. He does not have any smokeless tobacco history on file. He reports that he drinks alcohol. He reports that he does not use illicit drugs. family history includes Breast cancer in an unspecified family member; COPD in his father; Diabetes in his mother; Emphysema in an unspecified family member; Heart disease in his father; and Hypertension in his father. No Known Allergies     Objective:   Physical Exam    Constitutional: He appears well-developed and well-nourished.  HENT:  Head: Normocephalic and atraumatic.  Eyes: Conjunctivae are normal. Pupils are equal, round, and reactive to light.  Neck: Normal range of motion. Neck supple.  Cardiovascular: Normal rate and regular rhythm.   Pulmonary/Chest: Effort normal and breath sounds normal.  Abdominal: Soft. Bowel sounds are normal.          Assessment & Plan:  His lipids are in excellent control with an HDL of 47 and an LDL C. of 83.  His blood pressure is well controlled with generic losartan.  He is concerned about the cost of his medications when he goes to Medicare we discussed the fact of Vytorin to be split into 2 different medication since that combination medication.  We reviewed his exercise diet and believes that he is doing well we recommended that he resume Zyrtec 10 mg by mouth daily and use nasal saline for increased sinus symptoms

## 2011-04-05 ENCOUNTER — Telehealth: Payer: Self-pay | Admitting: *Deleted

## 2011-04-05 ENCOUNTER — Ambulatory Visit (INDEPENDENT_AMBULATORY_CARE_PROVIDER_SITE_OTHER): Payer: BC Managed Care – PPO | Admitting: Internal Medicine

## 2011-04-05 ENCOUNTER — Ambulatory Visit: Payer: BC Managed Care – PPO | Admitting: Internal Medicine

## 2011-04-05 VITALS — BP 134/80 | HR 76 | Temp 98.2°F | Resp 16 | Ht 68.5 in | Wt 154.0 lb

## 2011-04-05 DIAGNOSIS — B029 Zoster without complications: Secondary | ICD-10-CM

## 2011-04-05 MED ORDER — VALACYCLOVIR HCL 1 G PO TABS: 1000.0000 mg | ORAL_TABLET | Freq: Three times a day (TID) | ORAL | Status: DC

## 2011-04-05 NOTE — Progress Notes (Signed)
  Subjective:    Patient ID: Bryan Carson, male    DOB: 06/13/1946, 65 y.o.   MRN: [id number]  HPI Shingles on right thigh Appeared 2-3 days ago minimum pain The rash has classic shingles appearance with an erythematous base and ulcer or that is gray in appearance.  He did have chickenpox as a child he has no risk factors in terms of immunosuppressive and or diabetes he has a history of idiopathic peripheral autonomic neuropathy which also may affect the decreased pain he is experiencing from shingles  Review of Systems  Constitutional: Negative for fever and fatigue.  HENT: Negative for hearing loss, congestion, neck pain and postnasal drip.   Eyes: Negative for discharge, redness and visual disturbance.  Respiratory: Negative for cough, shortness of breath and wheezing.   Cardiovascular: Negative for leg swelling.  Gastrointestinal: Negative for abdominal pain, constipation and abdominal distention.  Genitourinary: Negative for urgency and frequency.  Musculoskeletal: Negative for joint swelling and arthralgias.  Skin: Positive for color change and rash.  Neurological: Negative for weakness and light-headedness.  Hematological: Negative for adenopathy.  Psychiatric/Behavioral: Negative for behavioral problems.       Past Medical History  Diagnosis Date  . Hyperlipidemia   . Diverticulosis   . Hearing loss of right ear   . Hypertension   . B12 deficiency anemia    Past Surgical History  Procedure Date  . Kidney stone surgery   . Colonoscopy     reports that he has never smoked. He does not have any smokeless tobacco history on file. He reports that he drinks alcohol. He reports that he does not use illicit drugs. family history includes Breast cancer in an unspecified family member; COPD in his father; Diabetes in his mother; Emphysema in an unspecified family member; Heart disease in his father; and Hypertension in his father. No Known Allergies  Objective:   Physical  Exam  Nursing note and vitals reviewed. Constitutional: He is oriented to person, place, and time. He appears well-developed and well-nourished.  HENT:  Head: Normocephalic and atraumatic.  Eyes: Conjunctivae are normal. Pupils are equal, round, and reactive to light.  Neck: Normal range of motion. Neck supple.  Cardiovascular: Normal rate and regular rhythm.   Pulmonary/Chest: Effort normal and breath sounds normal.  Abdominal: Soft. Bowel sounds are normal.  Musculoskeletal: He exhibits no tenderness.  Neurological: He is alert and oriented to person, place, and time.  Skin:       Classic herpes zoster or vesicular lesions  Psychiatric: His behavior is normal.          Assessment & Plan:  The most probable diagnosis is early herpes zoster oh with a modified pain response because of his history of neuropathy.  We will treat him with Valtrex 1 g by mouth 3 times a day for 5-7 days observe the rash if rash worsens or spreads he should call us at the rash continues to resolve and the lesions dried up and we have successfully treated this we also warned him about pressure post herpetic neuralgia and symptoms to look for for that.  If there is any streaking or consolidation of the lesions that would suggest that the diagnosis should be cellulitis he will contact her office immediately

## 2011-04-05 NOTE — Telephone Encounter (Signed)
Ov given for this pm

## 2011-04-05 NOTE — Telephone Encounter (Signed)
Pt is worried about some lesions on the back of his right leg that are painful, and a little itchy. He thinks they may be shingles.  Please call and live him a message.

## 2011-04-23 ENCOUNTER — Encounter: Payer: Self-pay | Admitting: Internal Medicine

## 2011-06-11 ENCOUNTER — Other Ambulatory Visit: Payer: Self-pay | Admitting: Internal Medicine

## 2011-08-02 ENCOUNTER — Telehealth: Payer: Self-pay | Admitting: Family Medicine

## 2011-08-02 DIAGNOSIS — Z0000 Encounter for general adult medical examination without abnormal findings: Secondary | ICD-10-CM

## 2011-08-02 NOTE — Telephone Encounter (Signed)
This pt is going to [facility] on 11/19 for cpx labs. Can the orders be put it please? Thanks.

## 2011-08-02 NOTE — Telephone Encounter (Signed)
done

## 2011-08-09 ENCOUNTER — Other Ambulatory Visit: Payer: Self-pay | Admitting: Internal Medicine

## 2011-08-09 ENCOUNTER — Other Ambulatory Visit: Payer: BC Managed Care – PPO

## 2011-08-09 ENCOUNTER — Other Ambulatory Visit (INDEPENDENT_AMBULATORY_CARE_PROVIDER_SITE_OTHER): Payer: BC Managed Care – PPO

## 2011-08-09 DIAGNOSIS — Z0000 Encounter for general adult medical examination without abnormal findings: Secondary | ICD-10-CM

## 2011-08-09 LAB — CBC WITH DIFFERENTIAL/PLATELET
Basophils Relative: 0.5 % (ref 0.0–3.0)
Eosinophils Absolute: 0.6 10*3/uL (ref 0.0–0.7)
Eosinophils Relative: 8.5 % — ABNORMAL HIGH (ref 0.0–5.0)
HCT: 42.7 % (ref 39.0–52.0)
Lymphs Abs: 1.9 10*3/uL (ref 0.7–4.0)
MCHC: 33.9 g/dL (ref 30.0–36.0)
MCV: 89.5 fl (ref 78.0–100.0)
Monocytes Absolute: 0.4 10*3/uL (ref 0.1–1.0)
Platelets: 208 10*3/uL (ref 150.0–400.0)
WBC: 7.4 10*3/uL (ref 4.5–10.5)

## 2011-08-09 LAB — HEPATIC FUNCTION PANEL
ALT: 32 U/L (ref 0–53)
Total Bilirubin: 1.3 mg/dL — ABNORMAL HIGH (ref 0.3–1.2)
Total Protein: 6.6 g/dL (ref 6.0–8.3)

## 2011-08-09 LAB — LIPID PANEL
Cholesterol: 121 mg/dL (ref 0–200)
Triglycerides: 170 mg/dL — ABNORMAL HIGH (ref 0.0–149.0)

## 2011-08-09 LAB — BASIC METABOLIC PANEL
GFR: 49.63 mL/min — ABNORMAL LOW (ref >60.00)
Potassium: 4.7 mEq/L (ref 3.5–5.1)
Sodium: 142 mEq/L (ref 135–145)

## 2011-08-16 ENCOUNTER — Other Ambulatory Visit: Payer: Self-pay | Admitting: *Deleted

## 2011-08-16 ENCOUNTER — Encounter: Payer: Self-pay | Admitting: Internal Medicine

## 2011-08-16 ENCOUNTER — Ambulatory Visit (INDEPENDENT_AMBULATORY_CARE_PROVIDER_SITE_OTHER): Payer: BC Managed Care – PPO | Admitting: Internal Medicine

## 2011-08-16 DIAGNOSIS — G9009 Other idiopathic peripheral autonomic neuropathy: Secondary | ICD-10-CM

## 2011-08-16 DIAGNOSIS — Z0000 Encounter for general adult medical examination without abnormal findings: Secondary | ICD-10-CM

## 2011-08-16 DIAGNOSIS — E785 Hyperlipidemia, unspecified: Secondary | ICD-10-CM

## 2011-08-16 DIAGNOSIS — I10 Essential (primary) hypertension: Secondary | ICD-10-CM

## 2011-08-16 DIAGNOSIS — N200 Calculus of kidney: Secondary | ICD-10-CM

## 2011-08-16 MED ORDER — AMLODIPINE BESYLATE 5 MG PO TABS: 5.0000 mg | ORAL_TABLET | Freq: Every day | ORAL | Status: DC

## 2011-08-16 NOTE — Progress Notes (Signed)
Subjective:    Patient ID: Bryan Carson, male    DOB: Nov 18, 1945, 65 y.o.   MRN: [id number]  HPI  CPX And follow up of HTN, hyperlipidemia, And neuropathy... The neuropathy has been stable with the b12 therapy and the pain has not increased As some persistent elevation in creatinine he is also on Cozaar 100 mg daily this will affect the BUN/creatinine ratio    Review of Systems  Constitutional: Negative for fever and fatigue.  HENT: Negative for hearing loss, congestion, neck pain and postnasal drip.   Eyes: Negative for discharge, redness and visual disturbance.  Respiratory: Negative for cough, shortness of breath and wheezing.   Cardiovascular: Negative for leg swelling.  Gastrointestinal: Negative for abdominal pain, constipation and abdominal distention.  Genitourinary: Negative for urgency and frequency.  Musculoskeletal: Negative for joint swelling and arthralgias.  Skin: Negative for color change and rash.  Neurological: Negative for weakness and light-headedness.  Hematological: Negative for adenopathy.  Psychiatric/Behavioral: Negative for behavioral problems.   Past Medical History  Diagnosis Date  . Hyperlipidemia   . Diverticulosis   . Hearing loss of right ear   . Hypertension   . B12 deficiency anemia     History   Social History  . Marital Status: Married    Spouse Name: N/A    Number of Children: N/A  . Years of Education: N/A   Occupational History  . Not on file.   Social History Main Topics  . Smoking status: Never Smoker   . Smokeless tobacco: Not on file  . Alcohol Use: Yes  . Drug Use: No  . Sexually Active: Yes   Other Topics Concern  . Not on file   Social History Narrative  . No narrative on file    Past Surgical History  Procedure Date  . Kidney stone surgery   . Colonoscopy     Family History  Problem Relation Age of Onset  . Emphysema    . Breast cancer    . Diabetes Mother   . Hypertension Father   . Heart disease  Father   . COPD Father     No Known Allergies  Current Outpatient Prescriptions on File Prior to Visit  Medication Sig Dispense Refill  . aspirin 81 MG tablet Take 81 mg by mouth daily.        . cetirizine (ZYRTEC) 10 MG tablet Take 10 mg by mouth daily.        . folic acid-pyridoxine-cyancobalamin (FOLTX) 2.5-25-2 MG TABS Take 1 tablet by mouth daily.  90 each  3  . losartan (COZAAR) 100 MG tablet Take 100 mg by mouth daily.        . niacin 500 MG CR capsule Take 500 mg by mouth at bedtime.        . valACYclovir (VALTREX) 1000 MG tablet Take 1 tablet (1,000 mg total) by mouth 3 (three) times daily.  30 tablet  0  . VYTORIN 10-40 MG per tablet TAKE 1 TABLET DAILY  90 tablet  2    BP 130/80  Pulse 72  Temp 98.2 F (36.8 C)  Resp 16  Ht 5\' 8"  (1.727 m)  Wt 162 lb (73.483 kg)  BMI 24.63 kg/m2       Objective:   Physical Exam  Nursing note and vitals reviewed. Constitutional: He is oriented to person, place, and time. He appears well-developed and well-nourished.  HENT:  Head: Normocephalic and atraumatic.  Eyes: Conjunctivae are normal. Pupils are equal, round,  and reactive to light.  Neck: Normal range of motion. Neck supple.  Cardiovascular: Normal rate and regular rhythm.   Pulmonary/Chest: Effort normal and breath sounds normal.  Abdominal: Soft. Bowel sounds are normal.  Genitourinary: Rectum normal and prostate normal.  Musculoskeletal: Normal range of motion.  Neurological: He is alert and oriented to person, place, and time.  Skin: Skin is dry.  Psychiatric: He has a normal mood and affect. His behavior is normal.          Assessment & Plan:   Informed consent was obtained and peroxide gel was inserted into the ears bilaterally using the lavage kit the ears were lavaged until clean.Inspection with a cerumen spoon removed residual wax. Patient tolerated the procedure well.  Discontinue the Cozaar and change blood pressure medicine to Norvasc 5 mg by mouth  daily Will monitor BUN creatinine in 3 months time.

## 2011-08-16 NOTE — Patient Instructions (Signed)
Discontinue the Cozaar and begin amlodipine 5 mg by mouth at bedtime

## 2011-08-17 ENCOUNTER — Other Ambulatory Visit: Payer: Self-pay | Admitting: *Deleted

## 2011-08-17 DIAGNOSIS — I10 Essential (primary) hypertension: Secondary | ICD-10-CM

## 2011-08-17 MED ORDER — AMLODIPINE BESYLATE 5 MG PO TABS: 5.0000 mg | ORAL_TABLET | Freq: Every day | ORAL | Status: DC

## 2011-11-09 ENCOUNTER — Other Ambulatory Visit (INDEPENDENT_AMBULATORY_CARE_PROVIDER_SITE_OTHER): Payer: BC Managed Care – PPO

## 2011-11-09 DIAGNOSIS — I10 Essential (primary) hypertension: Secondary | ICD-10-CM

## 2011-11-09 LAB — BASIC METABOLIC PANEL
BUN: 20 mg/dL (ref 6–23)
Chloride: 106 mEq/L (ref 96–112)
GFR: 51.15 mL/min — ABNORMAL LOW (ref >60.00)
Potassium: 5.1 mEq/L (ref 3.5–5.1)
Sodium: 143 mEq/L (ref 135–145)

## 2011-11-10 ENCOUNTER — Other Ambulatory Visit: Payer: BC Managed Care – PPO

## 2011-11-16 ENCOUNTER — Ambulatory Visit (INDEPENDENT_AMBULATORY_CARE_PROVIDER_SITE_OTHER): Payer: BC Managed Care – PPO | Admitting: Internal Medicine

## 2011-11-16 ENCOUNTER — Encounter: Payer: Self-pay | Admitting: Internal Medicine

## 2011-11-16 DIAGNOSIS — E785 Hyperlipidemia, unspecified: Secondary | ICD-10-CM

## 2011-11-16 DIAGNOSIS — N183 Chronic kidney disease, stage 3 unspecified: Secondary | ICD-10-CM | POA: Insufficient documentation

## 2011-11-16 DIAGNOSIS — Z0000 Encounter for general adult medical examination without abnormal findings: Secondary | ICD-10-CM

## 2011-11-16 DIAGNOSIS — I10 Essential (primary) hypertension: Secondary | ICD-10-CM

## 2011-11-16 DIAGNOSIS — N182 Chronic kidney disease, stage 2 (mild): Secondary | ICD-10-CM

## 2011-11-16 MED ORDER — AMLODIPINE BESYLATE 5 MG PO TABS: 5.0000 mg | ORAL_TABLET | Freq: Every day | ORAL | Status: DC

## 2011-11-16 NOTE — Patient Instructions (Signed)
Avoid to frequent OJ or bananas.Marland KitchenMarland Kitchen

## 2011-11-16 NOTE — Progress Notes (Signed)
Subjective:    Patient ID: Bryan Carson, male    DOB: 1946-06-11, 66 y.o.   MRN: [id number]  HPI The pt had facial nerve pain on the left. The symptoms lasted for 2-3 weeks and has resolved. This is the second episode. Use of jaw seemed to increased the pain leading to TMJ as a part of the differential. CRI stable with HTN hx Lipids stable    Review of Systems  Constitutional: Negative for fever and fatigue.  HENT: Negative for hearing loss, congestion, neck pain and postnasal drip.   Eyes: Negative for discharge, redness and visual disturbance.  Respiratory: Negative for cough, shortness of breath and wheezing.   Cardiovascular: Negative for leg swelling.  Gastrointestinal: Negative for abdominal pain, constipation and abdominal distention.  Genitourinary: Negative for urgency and frequency.  Musculoskeletal: Negative for joint swelling and arthralgias.  Skin: Negative for color change and rash.  Neurological: Negative for weakness and light-headedness.  Hematological: Negative for adenopathy.  Psychiatric/Behavioral: Negative for behavioral problems.   Past Medical History  Diagnosis Date  . Hyperlipidemia   . Diverticulosis   . Hearing loss of right ear   . Hypertension   . B12 deficiency anemia     History   Social History  . Marital Status: Married    Spouse Name: N/A    Number of Children: N/A  . Years of Education: N/A   Occupational History  . Not on file.   Social History Main Topics  . Smoking status: Never Smoker   . Smokeless tobacco: Not on file  . Alcohol Use: Yes  . Drug Use: No  . Sexually Active: Yes   Other Topics Concern  . Not on file   Social History Narrative  . No narrative on file    Past Surgical History  Procedure Date  . Kidney stone surgery   . Colonoscopy     Family History  Problem Relation Age of Onset  . Emphysema    . Breast cancer    . Diabetes Mother   . Hypertension Father   . Heart disease Father   . COPD  Father     No Known Allergies  Current Outpatient Prescriptions on File Prior to Visit  Medication Sig Dispense Refill  . amLODipine (NORVASC) 5 MG tablet Take 1 tablet (5 mg total) by mouth daily.  90 tablet  3  . aspirin 81 MG tablet Take 81 mg by mouth daily.        . cetirizine (ZYRTEC) 10 MG tablet Take 10 mg by mouth daily.        . Cholecalciferol (VITAMIN D3) 2000 UNITS TABS Take 2,000 Units by mouth daily.        . folic acid-pyridoxine-cyancobalamin (FOLTX) 2.5-25-2 MG TABS Take 1 tablet by mouth daily.  90 each  3  . niacin 500 MG CR capsule Take 500 mg by mouth at bedtime.        Marland Kitchen VYTORIN 10-40 MG per tablet TAKE 1 TABLET DAILY  90 tablet  2    BP 130/80  Pulse 80  Temp 98.1 F (36.7 C)  Resp 14  Ht 5\' 8"  (1.727 m)  Wt 162 lb (73.483 kg)  BMI 24.63 kg/m2        Objective:   Physical Exam  Nursing note and vitals reviewed. Constitutional: He appears well-developed and well-nourished.  HENT:  Head: Normocephalic and atraumatic.  Eyes: Conjunctivae are normal. Pupils are equal, round, and reactive to light.  Neck: Normal  range of motion. Neck supple.  Cardiovascular: Normal rate and regular rhythm.   Pulmonary/Chest: Effort normal and breath sounds normal.  Abdominal: Soft. Bowel sounds are normal.          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

## 2011-11-16 NOTE — Progress Notes (Signed)
Addended by: Stacie Glaze on: 11/16/2011 10:13 AM   Modules accepted: Orders

## 2011-12-13 ENCOUNTER — Ambulatory Visit (INDEPENDENT_AMBULATORY_CARE_PROVIDER_SITE_OTHER): Payer: BC Managed Care – PPO | Admitting: Family Medicine

## 2011-12-13 ENCOUNTER — Encounter: Payer: Self-pay | Admitting: Family Medicine

## 2011-12-13 VITALS — BP 150/88 | Temp 98.7°F | Wt 159.0 lb

## 2011-12-13 DIAGNOSIS — R519 Headache, unspecified: Secondary | ICD-10-CM

## 2011-12-13 DIAGNOSIS — R51 Headache: Secondary | ICD-10-CM

## 2011-12-13 MED ORDER — MELOXICAM 15 MG PO TABS: 15.0000 mg | ORAL_TABLET | Freq: Every day | ORAL | Status: DC

## 2011-12-13 NOTE — Patient Instructions (Signed)
Temporomandibular Problems   Temporomandibular joint (TMJ) dysfunction means there are problems with the joint between your jaw and your skull. This is a joint lined by cartilage like other joints in your body but also has a small disc in the joint which keeps the bones from rubbing on each other. These joints are like other joints and can get inflamed (sore) from arthritis and other problems. When this joint gets sore, it can cause headaches and pain in the jaw and the face.   CAUSES   Usually the arthritic types of problems are caused by soreness in the joint. Soreness in the joint can also be caused by overuse. This may come from grinding your teeth. It may also come from mis-alignment in the joint.   DIAGNOSIS   Diagnosis of this condition can often be made by history and exam. Sometimes your caregiver may need X-rays or an MRI scan to determine the exact cause. It may be necessary to see your dentist to determine if your teeth and jaws are lined up correctly.   TREATMENT   Most of the time this problem is not serious; however, sometimes it can persist (become chronic). When this happens medications that will cut down on inflammation (soreness) help. Sometimes a shot of cortisone into the joint will be helpful. If your teeth are not aligned it may help for your dentist to make a splint for your mouth that can help this problem. If no physical problems can be found, the problem may come from tension. If tension is found to be the cause, biofeedback or relaxation techniques may be helpful.   HOME CARE INSTRUCTIONS   Later in the day, applications of ice packs may be helpful. Ice can be used in a plastic bag with a towel around it to prevent frostbite to skin. This may be used about every 2 hours for 20 to 30 minutes, as needed while awake, or as directed by your caregiver.   Only take over-the-counter or prescription medicines for pain, discomfort, or fever as directed by your caregiver.   If physical therapy was  prescribed, follow your caregiver's directions.   Wear mouth appliances as directed if they were given.   Document Released: 06/01/2001 Document Revised: 08/26/2011 Document Reviewed: 09/08/2008   [employer]® Patient Information ©2012 [employer].

## 2011-12-13 NOTE — Progress Notes (Signed)
  Subjective:    Patient ID: Bryan Carson, male    DOB: [birth year], 66 y.o.   MRN: [id number]  HPI  Left sided facial pain. Five-day duration. History of similar process in the past. Denies any chest pain. Sharp pain mostly TMJ area but radiates somewhat toward the lip occasionally. Worse with chewing. Took some Motrin with some relief. Denies sore throat. No hearing loss. No ear pain. Similar episode back in February which resolved and also similar episode couple years ago.  No skin rash.  No visible swelling.  Past Medical History  Diagnosis Date  . Hyperlipidemia   . Diverticulosis   . Hearing loss of right ear   . Hypertension   . B12 deficiency anemia    Past Surgical History  Procedure Date  . Kidney stone surgery   . Colonoscopy     reports that he has never smoked. He does not have any smokeless tobacco history on file. He reports that he drinks alcohol. He reports that he does not use illicit drugs. family history includes Breast cancer in an unspecified family member; COPD in his father; Diabetes in his mother; Emphysema in an unspecified family member; Heart disease in his father; and Hypertension in his father. No Known Allergies    Review of Systems  Constitutional: Negative for fever and chills.  HENT: Negative for hearing loss, sore throat and trouble swallowing.   Skin: Negative for rash.  Neurological: Negative for weakness and numbness.  Hematological: Negative for adenopathy.       Objective:   Physical Exam  Constitutional: He is oriented to person, place, and time. He appears well-developed and well-nourished.  HENT:  Right Ear: External ear normal.  Left Ear: External ear normal.  Mouth/Throat: Oropharynx is clear and moist.  Neck: Neck supple. No thyromegaly present.  Cardiovascular: Normal rate and regular rhythm.   Pulmonary/Chest: Effort normal and breath sounds normal. No respiratory distress. He has no wheezes. He has no rales.  Lymphadenopathy:     He has no cervical adenopathy.  Neurological: He is alert and oriented to person, place, and time. No cranial nerve deficit.  Skin: No rash noted.          Assessment & Plan:  Left facial pain. Suspect TMJ syndrome. Doubt trigeminal neuralgia. Try meloxicam 15 mg daily. Avoid hard to chew foods. Followup with primary physician not improving over the next couple of weeks

## 2011-12-21 ENCOUNTER — Other Ambulatory Visit: Payer: Self-pay | Admitting: Internal Medicine

## 2011-12-24 ENCOUNTER — Telehealth: Payer: Self-pay | Admitting: *Deleted

## 2011-12-24 NOTE — Telephone Encounter (Signed)
Patient is aware 

## 2011-12-24 NOTE — Telephone Encounter (Signed)
Continue mobic and if it doesn't go away may need to go to dentist

## 2011-12-24 NOTE — Telephone Encounter (Signed)
Patient is calling because he is continuing to have left side facial pain.  The pain increases when eating and talking.  He is taking Mobic with some relief.  Any suggestions?

## 2011-12-30 ENCOUNTER — Other Ambulatory Visit: Payer: Self-pay | Admitting: Oral Surgery

## 2011-12-30 DIAGNOSIS — G500 Trigeminal neuralgia: Secondary | ICD-10-CM

## 2012-01-04 ENCOUNTER — Ambulatory Visit
Admission: RE | Admit: 2012-01-04 | Discharge: 2012-01-04 | Disposition: A | Discharge status: Home / Self Care | Payer: BC Managed Care – PPO | Source: Ambulatory Visit | Attending: Oral Surgery | Admitting: Oral Surgery

## 2012-01-04 DIAGNOSIS — G500 Trigeminal neuralgia: Secondary | ICD-10-CM

## 2012-01-04 MED ORDER — GADOBENATE DIMEGLUMINE 529 MG/ML IV SOLN
15.0000 mL | Freq: Once | INTRAVENOUS | Status: AC | PRN
  Administered 2012-01-04: 15 mL via INTRAVENOUS

## 2012-01-19 DIAGNOSIS — G500 Trigeminal neuralgia: Secondary | ICD-10-CM | POA: Insufficient documentation

## 2012-06-24 ENCOUNTER — Other Ambulatory Visit: Payer: Self-pay | Admitting: Internal Medicine

## 2012-08-02 DIAGNOSIS — H9190 Unspecified hearing loss, unspecified ear: Secondary | ICD-10-CM | POA: Insufficient documentation

## 2012-08-09 ENCOUNTER — Other Ambulatory Visit: Payer: BC Managed Care – PPO

## 2012-08-16 ENCOUNTER — Encounter: Payer: BC Managed Care – PPO | Admitting: Internal Medicine

## 2012-08-22 ENCOUNTER — Other Ambulatory Visit: Payer: BC Managed Care – PPO

## 2012-09-02 ENCOUNTER — Other Ambulatory Visit: Payer: Self-pay | Admitting: Internal Medicine

## 2012-09-22 ENCOUNTER — Other Ambulatory Visit (INDEPENDENT_AMBULATORY_CARE_PROVIDER_SITE_OTHER): Payer: BC Managed Care – PPO

## 2012-09-22 DIAGNOSIS — Z0000 Encounter for general adult medical examination without abnormal findings: Secondary | ICD-10-CM

## 2012-09-22 LAB — CBC WITH DIFFERENTIAL/PLATELET
Basophils Absolute: 0 10*3/uL (ref 0.0–0.1)
Eosinophils Absolute: 0.3 10*3/uL (ref 0.0–0.7)
MCHC: 33.7 g/dL (ref 30.0–36.0)
MCV: 85.9 fl (ref 78.0–100.0)
Monocytes Absolute: 0.5 10*3/uL (ref 0.1–1.0)
Neutrophils Relative %: 64.4 % (ref 43.0–77.0)
Platelets: 238 10*3/uL (ref 150.0–400.0)
RDW: 13 % (ref 11.5–14.6)

## 2012-09-22 LAB — HEPATIC FUNCTION PANEL
Albumin: 4.1 g/dL (ref 3.5–5.2)
Total Bilirubin: 1.5 mg/dL — ABNORMAL HIGH (ref 0.3–1.2)

## 2012-09-22 LAB — LIPID PANEL
Cholesterol: 122 mg/dL (ref 0–200)
HDL: 37.9 mg/dL — ABNORMAL LOW (ref >39.00)
VLDL: 28.6 mg/dL (ref 0.0–40.0)

## 2012-09-22 LAB — POCT URINALYSIS DIPSTICK
Bilirubin, UA: NEGATIVE
Glucose, UA: NEGATIVE
Ketones, UA: NEGATIVE
Leukocytes, UA: NEGATIVE
Protein, UA: NEGATIVE

## 2012-09-22 LAB — BASIC METABOLIC PANEL
BUN: 16 mg/dL (ref 6–23)
Calcium: 9.7 mg/dL (ref 8.4–10.5)
GFR: 61.5 mL/min (ref >60.00)
Glucose, Bld: 102 mg/dL — ABNORMAL HIGH (ref 70–99)
Sodium: 140 mEq/L (ref 135–145)

## 2012-09-22 LAB — TSH: TSH: 4.8 u[IU]/mL (ref 0.35–5.50)

## 2012-09-29 ENCOUNTER — Ambulatory Visit (INDEPENDENT_AMBULATORY_CARE_PROVIDER_SITE_OTHER): Payer: BC Managed Care – PPO | Admitting: Internal Medicine

## 2012-09-29 ENCOUNTER — Encounter: Payer: Self-pay | Admitting: Internal Medicine

## 2012-09-29 VITALS — BP 144/78 | HR 80 | Temp 98.2°F | Resp 16 | Ht 68.0 in | Wt 154.0 lb

## 2012-09-29 DIAGNOSIS — N182 Chronic kidney disease, stage 2 (mild): Secondary | ICD-10-CM

## 2012-09-29 DIAGNOSIS — Z0000 Encounter for general adult medical examination without abnormal findings: Secondary | ICD-10-CM

## 2012-09-29 DIAGNOSIS — I10 Essential (primary) hypertension: Secondary | ICD-10-CM

## 2012-09-29 NOTE — Progress Notes (Signed)
Subjective:    Patient ID: Bryan Carson, male    DOB: 04-13-1946, 67 y.o.   MRN: [id number]  HPI  Complications form surgery for trigeminal neuralgia CPX Hearing loss Lipid management HTN  Review of Systems  Constitutional: Negative for fever and fatigue.  HENT: Negative for hearing loss, congestion, neck pain and postnasal drip.   Eyes: Negative for discharge, redness and visual disturbance.  Respiratory: Negative for cough, shortness of breath and wheezing.   Cardiovascular: Negative for leg swelling.  Gastrointestinal: Negative for abdominal pain, constipation and abdominal distention.  Genitourinary: Negative for urgency and frequency.  Musculoskeletal: Negative for joint swelling and arthralgias.  Skin: Negative for color change and rash.  Neurological: Negative for weakness and light-headedness.  Hematological: Negative for adenopathy.  Psychiatric/Behavioral: Negative for behavioral problems.   Past Medical History  Diagnosis Date  . Hyperlipidemia   . Diverticulosis   . Hearing loss of right ear   . Hypertension   . B12 deficiency anemia     History   Social History  . Marital Status: Married    Spouse Name: N/A    Number of Children: N/A  . Years of Education: N/A   Occupational History  . Not on file.   Social History Main Topics  . Smoking status: Never Smoker   . Smokeless tobacco: Not on file  . Alcohol Use: Yes  . Drug Use: No  . Sexually Active: Yes   Other Topics Concern  . Not on file   Social History Narrative  . No narrative on file    Past Surgical History  Procedure Date  . Kidney stone surgery   . Colonoscopy     Family History  Problem Relation Age of Onset  . Emphysema    . Breast cancer    . Diabetes Mother   . Hypertension Father   . Heart disease Father   . COPD Father     No Known Allergies  Current Outpatient Prescriptions on File Prior to Visit  Medication Sig Dispense Refill  . amLODipine (NORVASC) 5 MG  tablet Take 1 tablet (5 mg total) by mouth daily.  90 tablet  3  . cetirizine (ZYRTEC) 10 MG tablet Take 10 mg by mouth daily.        . Cholecalciferol (VITAMIN D3) 2000 UNITS TABS Take 2,000 Units by mouth daily.        . FOLBIC 2.5-25-2 MG TABS TAKE 1 TABLET DAILY  90 tablet  1  . niacin 500 MG CR capsule Take 500 mg by mouth at bedtime.        Marland Kitchen VYTORIN 10-40 MG per tablet TAKE 1 TABLET DAILY  90 tablet  1  . aspirin 81 MG tablet Take 81 mg by mouth daily.          BP 144/78  Pulse 80  Temp 98.2 F (36.8 C)  Resp 16  Ht 5\' 8"  (1.727 m)  Wt 154 lb (69.854 kg)  BMI 23.42 kg/m2       Objective:   Physical Exam  Nursing note and vitals reviewed. Constitutional: He is oriented to person, place, and time. He appears well-developed and well-nourished.  HENT:  Head: Normocephalic and atraumatic.  Eyes: Conjunctivae normal are normal. Pupils are equal, round, and reactive to light.  Neck: Normal range of motion. Neck supple.  Cardiovascular: Normal rate and regular rhythm.   Pulmonary/Chest: Effort normal and breath sounds normal.  Abdominal: Soft. Bowel sounds are normal.  Genitourinary: Rectum normal  and prostate normal.  Musculoskeletal: Normal range of motion.  Neurological: He is alert and oriented to person, place, and time.  Skin: Skin is warm and dry.  Psychiatric: He has a normal mood and affect. His behavior is normal.          Assessment & Plan:   Patient presents for yearly preventative medicine examination.   all immunizations and health maintenance protocols were reviewed with the patient and they are up to date with these protocols.   screening laboratory values were reviewed with the patient including screening of hyperlipidemia PSA renal function and hepatic function.   There medications past medical history social history problem list and allergies were reviewed in detail.   Goals were established with regard to weight loss exercise diet in compliance  with medications

## 2012-10-27 ENCOUNTER — Other Ambulatory Visit: Payer: Self-pay | Admitting: Internal Medicine

## 2012-12-21 ENCOUNTER — Other Ambulatory Visit: Payer: Self-pay | Admitting: Internal Medicine

## 2013-02-02 ENCOUNTER — Encounter: Payer: Self-pay | Admitting: Internal Medicine

## 2013-02-02 ENCOUNTER — Ambulatory Visit (INDEPENDENT_AMBULATORY_CARE_PROVIDER_SITE_OTHER): Payer: BC Managed Care – PPO | Admitting: Internal Medicine

## 2013-02-02 VITALS — BP 128/74 | HR 72 | Temp 98.1°F | Resp 16 | Ht 68.0 in | Wt 152.0 lb

## 2013-02-02 DIAGNOSIS — I10 Essential (primary) hypertension: Secondary | ICD-10-CM

## 2013-02-02 DIAGNOSIS — G9009 Other idiopathic peripheral autonomic neuropathy: Secondary | ICD-10-CM

## 2013-02-02 DIAGNOSIS — N182 Chronic kidney disease, stage 2 (mild): Secondary | ICD-10-CM

## 2013-02-02 NOTE — Patient Instructions (Signed)
The patient is instructed to continue all medications as prescribed. Schedule followup with check out clerk upon leaving the clinic  

## 2013-02-02 NOTE — Progress Notes (Signed)
Subjective:    Patient ID: Bryan Carson, male    DOB: 1945-11-07, 67 y.o.   MRN: [id number]  HPI Still having issues following the trigenimal surgery on the left. Persistant numbness of face HTN stable Stable lipids     Review of Systems  Constitutional: Negative for fever and fatigue.  HENT: Negative for hearing loss, congestion, neck pain and postnasal drip.   Eyes: Negative for discharge, redness and visual disturbance.  Respiratory: Negative for cough, shortness of breath and wheezing.   Cardiovascular: Negative for leg swelling.  Gastrointestinal: Negative for abdominal pain, constipation and abdominal distention.  Genitourinary: Negative for urgency and frequency.  Musculoskeletal: Negative for joint swelling and arthralgias.  Skin: Negative for color change and rash.  Neurological: Positive for facial asymmetry and numbness. Negative for weakness and light-headedness.  Hematological: Negative for adenopathy.  Psychiatric/Behavioral: Negative for behavioral problems.   Past Medical History  Diagnosis Date  . Hyperlipidemia   . Diverticulosis   . Hearing loss of right ear   . Hypertension   . B12 deficiency anemia     History   Social History  . Marital Status: Married    Spouse Name: N/A    Number of Children: N/A  . Years of Education: N/A   Occupational History  . Not on file.   Social History Main Topics  . Smoking status: Never Smoker   . Smokeless tobacco: Not on file  . Alcohol Use: Yes  . Drug Use: No  . Sexually Active: Yes   Other Topics Concern  . Not on file   Social History Narrative  . No narrative on file    Past Surgical History  Procedure Laterality Date  . Kidney stone surgery    . Colonoscopy      Family History  Problem Relation Age of Onset  . Emphysema    . Breast cancer    . Diabetes Mother   . Hypertension Father   . Heart disease Father   . COPD Father     No Known Allergies  Current Outpatient Prescriptions  on File Prior to Visit  Medication Sig Dispense Refill  . amLODipine (NORVASC) 5 MG tablet TAKE 1 TABLET DAILY  90 tablet  2  . aspirin 81 MG tablet Take 81 mg by mouth daily.        . cetirizine (ZYRTEC) 10 MG tablet Take 10 mg by mouth daily.        . Cholecalciferol (VITAMIN D3) 2000 UNITS TABS Take 2,000 Units by mouth daily.        . FOLBIC 2.5-25-2 MG TABS TAKE 1 TABLET DAILY  90 tablet  1  . niacin 500 MG CR capsule Take 500 mg by mouth at bedtime.        Marland Kitchen VYTORIN 10-40 MG per tablet TAKE 1 TABLET DAILY  90 tablet  0   No current facility-administered medications on file prior to visit.    BP 128/74  Pulse 72  Temp(Src) 98.1 F (36.7 C)  Resp 16  Ht 5\' 8"  (1.727 m)  Wt 152 lb (68.947 kg)  BMI 23.12 kg/m2       Objective:   Physical Exam  Nursing note and vitals reviewed. Constitutional: He appears well-developed and well-nourished.  HENT:  Head: Normocephalic and atraumatic.  Eyes: Conjunctivae are normal. Pupils are equal, round, and reactive to light.  Neck: Normal range of motion. Neck supple.  Cardiovascular: Normal rate and regular rhythm.   Pulmonary/Chest: Effort normal and  breath sounds normal.  Abdominal: Soft. Bowel sounds are normal.          Assessment & Plan:  Blood pressure is well controlled with current regimen.  He is stable lipids.  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  [facility] has  released containing hearing for the hearing loss associated with this surgery  Add allergy nasonex for allergies

## 2013-03-24 ENCOUNTER — Other Ambulatory Visit: Payer: Self-pay | Admitting: Internal Medicine

## 2013-07-10 ENCOUNTER — Other Ambulatory Visit: Payer: Self-pay | Admitting: Internal Medicine

## 2013-07-20 ENCOUNTER — Other Ambulatory Visit: Payer: Self-pay | Admitting: Internal Medicine

## 2013-07-20 DIAGNOSIS — I10 Essential (primary) hypertension: Secondary | ICD-10-CM

## 2013-07-23 ENCOUNTER — Other Ambulatory Visit (INDEPENDENT_AMBULATORY_CARE_PROVIDER_SITE_OTHER): Payer: BC Managed Care – PPO

## 2013-07-23 DIAGNOSIS — I10 Essential (primary) hypertension: Secondary | ICD-10-CM

## 2013-07-23 LAB — BASIC METABOLIC PANEL
CO2: 28 mEq/L (ref 19–32)
Calcium: 9.7 mg/dL (ref 8.4–10.5)
Potassium: 4.8 mEq/L (ref 3.5–5.1)
Sodium: 141 mEq/L (ref 135–145)

## 2013-08-14 ENCOUNTER — Ambulatory Visit (INDEPENDENT_AMBULATORY_CARE_PROVIDER_SITE_OTHER): Payer: BC Managed Care – PPO | Admitting: *Deleted

## 2013-08-14 DIAGNOSIS — Z23 Encounter for immunization: Secondary | ICD-10-CM

## 2013-08-23 DIAGNOSIS — Z85828 Personal history of other malignant neoplasm of skin: Secondary | ICD-10-CM | POA: Insufficient documentation

## 2013-08-26 ENCOUNTER — Other Ambulatory Visit: Payer: Self-pay | Admitting: Internal Medicine

## 2013-09-28 ENCOUNTER — Other Ambulatory Visit (INDEPENDENT_AMBULATORY_CARE_PROVIDER_SITE_OTHER): Payer: BC Managed Care – PPO

## 2013-09-28 DIAGNOSIS — Z0000 Encounter for general adult medical examination without abnormal findings: Secondary | ICD-10-CM

## 2013-09-28 LAB — CBC WITH DIFFERENTIAL/PLATELET
BASOS PCT: 0.5 % (ref 0.0–3.0)
Basophils Absolute: 0 10*3/uL (ref 0.0–0.1)
Eosinophils Absolute: 0.7 10*3/uL (ref 0.0–0.7)
Eosinophils Relative: 8.3 % — ABNORMAL HIGH (ref 0.0–5.0)
HCT: 46.4 % (ref 39.0–52.0)
Hemoglobin: 15.4 g/dL (ref 13.0–17.0)
LYMPHS PCT: 26.1 % (ref 12.0–46.0)
Lymphs Abs: 2.2 10*3/uL (ref 0.7–4.0)
MCHC: 33.2 g/dL (ref 30.0–36.0)
MCV: 86.1 fl (ref 78.0–100.0)
Monocytes Absolute: 0.5 10*3/uL (ref 0.1–1.0)
Monocytes Relative: 5.9 % (ref 3.0–12.0)
Neutro Abs: 5 10*3/uL (ref 1.4–7.7)
Neutrophils Relative %: 59.2 % (ref 43.0–77.0)
Platelets: 237 10*3/uL (ref 150.0–400.0)
RBC: 5.39 Mil/uL (ref 4.22–5.81)
RDW: 13.4 % (ref 11.5–14.6)
WBC: 8.5 10*3/uL (ref 4.5–10.5)

## 2013-09-28 LAB — BASIC METABOLIC PANEL
BUN: 17 mg/dL (ref 6–23)
CO2: 28 mEq/L (ref 19–32)
Calcium: 9.7 mg/dL (ref 8.4–10.5)
Chloride: 106 mEq/L (ref 96–112)
Creatinine, Ser: 1.4 mg/dL (ref 0.4–1.5)
GFR: 55.16 mL/min — ABNORMAL LOW (ref >60.00)
Glucose, Bld: 92 mg/dL (ref 70–99)
Potassium: 4.4 mEq/L (ref 3.5–5.1)
Sodium: 141 mEq/L (ref 135–145)

## 2013-09-28 LAB — POCT URINALYSIS DIPSTICK
BILIRUBIN UA: NEGATIVE
GLUCOSE UA: NEGATIVE
KETONES UA: NEGATIVE
LEUKOCYTES UA: NEGATIVE
Nitrite, UA: NEGATIVE
Protein, UA: NEGATIVE
Spec Grav, UA: 1.025
Urobilinogen, UA: 0.2
pH, UA: 5.5

## 2013-09-28 LAB — HEPATIC FUNCTION PANEL
ALK PHOS: 77 U/L (ref 39–117)
ALT: 21 U/L (ref 0–53)
AST: 18 U/L (ref 0–37)
Albumin: 4.2 g/dL (ref 3.5–5.2)
BILIRUBIN DIRECT: 0.2 mg/dL (ref 0.0–0.3)
TOTAL PROTEIN: 6.7 g/dL (ref 6.0–8.3)
Total Bilirubin: 1 mg/dL (ref 0.3–1.2)

## 2013-09-28 LAB — TSH: TSH: 3.34 u[IU]/mL (ref 0.35–5.50)

## 2013-09-28 LAB — LIPID PANEL
CHOLESTEROL: 162 mg/dL (ref 0–200)
HDL: 42.4 mg/dL (ref >39.00)
LDL Cholesterol: 93 mg/dL (ref 0–99)
TRIGLYCERIDES: 131 mg/dL (ref 0.0–149.0)
Total CHOL/HDL Ratio: 4
VLDL: 26.2 mg/dL (ref 0.0–40.0)

## 2013-09-28 LAB — PSA: PSA: 1.63 ng/mL (ref 0.10–4.00)

## 2013-10-05 ENCOUNTER — Encounter: Payer: Self-pay | Admitting: Internal Medicine

## 2013-10-05 ENCOUNTER — Encounter: Payer: BC Managed Care – PPO | Admitting: Internal Medicine

## 2013-10-05 ENCOUNTER — Ambulatory Visit (INDEPENDENT_AMBULATORY_CARE_PROVIDER_SITE_OTHER): Payer: BC Managed Care – PPO | Admitting: Internal Medicine

## 2013-10-05 VITALS — BP 100/70 | HR 76 | Temp 98.0°F | Resp 16 | Ht 68.0 in | Wt 149.0 lb

## 2013-10-05 DIAGNOSIS — Z87442 Personal history of urinary calculi: Secondary | ICD-10-CM

## 2013-10-05 DIAGNOSIS — Z0000 Encounter for general adult medical examination without abnormal findings: Secondary | ICD-10-CM

## 2013-10-05 DIAGNOSIS — E785 Hyperlipidemia, unspecified: Secondary | ICD-10-CM

## 2013-10-05 DIAGNOSIS — R319 Hematuria, unspecified: Secondary | ICD-10-CM

## 2013-10-05 MED ORDER — EZETIMIBE 10 MG PO TABS: 10.0000 mg | ORAL_TABLET | Freq: Every day | ORAL | Status: DC

## 2013-10-05 MED ORDER — ATORVASTATIN CALCIUM 20 MG PO TABS: 20.0000 mg | ORAL_TABLET | Freq: Every day | ORAL | Status: DC

## 2013-10-05 NOTE — Progress Notes (Signed)
Subjective:    Patient ID: Bryan Carson, male    DOB: [birth year], 68 y.o.   MRN: [id number]  HPI   persistant low grade hematuria with history of renal stones Has had hematuria since 2011 and saw urology then slight increase in creatinine Hx of hydronephrosis CPX Insurance changed coverage of vytorin      Review of Systems  Constitutional: Negative for fever and fatigue.  HENT: Negative for congestion, hearing loss and postnasal drip.   Eyes: Negative for discharge, redness and visual disturbance.  Respiratory: Negative for cough, shortness of breath and wheezing.   Cardiovascular: Negative for leg swelling.  Gastrointestinal: Negative for abdominal pain, constipation and abdominal distention.  Genitourinary: Positive for hematuria. Negative for urgency and frequency.  Musculoskeletal: Negative for arthralgias, joint swelling and neck pain.  Skin: Negative for color change and rash.  Neurological: Negative for weakness and light-headedness.  Hematological: Negative for adenopathy.  Psychiatric/Behavioral: Negative for behavioral problems.   Past Medical History  Diagnosis Date  . Hyperlipidemia   . Diverticulosis   . Hearing loss of right ear   . Hypertension   . B12 deficiency anemia     History   Social History  . Marital Status: Married    Spouse Name: N/A    Number of Children: N/A  . Years of Education: N/A   Occupational History  . Not on file.   Social History Main Topics  . Smoking status: Never Smoker   . Smokeless tobacco: Not on file  . Alcohol Use: Yes  . Drug Use: No  . Sexual Activity: Yes   Other Topics Concern  . Not on file   Social History Narrative  . No narrative on file    Past Surgical History  Procedure Laterality Date  . Kidney stone surgery    . Colonoscopy      Family History  Problem Relation Age of Onset  . Emphysema    . Breast cancer    . Diabetes Mother   . Hypertension Father   . Heart disease Father   .  COPD Father     No Known Allergies  Current Outpatient Prescriptions on File Prior to Visit  Medication Sig Dispense Refill  . amLODipine (NORVASC) 5 MG tablet TAKE 1 TABLET DAILY  90 tablet  3  . aspirin 81 MG tablet Take 81 mg by mouth daily.        . cetirizine (ZYRTEC) 10 MG tablet Take 10 mg by mouth daily.        . Cholecalciferol (VITAMIN D3) 2000 UNITS TABS Take 2,000 Units by mouth daily.        . FOLBIC 2.5-25-2 MG TABS TAKE 1 TABLET DAILY  90 tablet  3  . niacin 500 MG CR capsule Take 500 mg by mouth at bedtime.        Marland Kitchen VYTORIN 10-40 MG per tablet TAKE 1 TABLET DAILY  90 tablet  3   No current facility-administered medications on file prior to visit.    BP 100/70  Pulse 76  Temp(Src) 98 F (36.7 C)  Resp 16  Ht 5\' 8"  (1.727 m)  Wt 149 lb (67.586 kg)  BMI 22.66 kg/m2        Objective:   Physical Exam  Constitutional: He is oriented to person, place, and time. He appears well-developed and well-nourished.  HENT:  Head: Normocephalic and atraumatic.  Eyes: Conjunctivae are normal. Pupils are equal, round, and reactive to light.  Neck: Normal  range of motion. Neck supple.  Cardiovascular: Normal rate and regular rhythm.   Murmur heard. Pulmonary/Chest: Effort normal and breath sounds normal.  Abdominal: Soft. Bowel sounds are normal.  Genitourinary: Prostate normal.  Musculoskeletal: Normal range of motion.  Neurological: He is alert and oriented to person, place, and time.          Assessment & Plan:   Patient presents for yearly preventative medicine examination.   all immunizations and health maintenance protocols were reviewed with the patient and they are up to date with these protocols. ria   screening laboratory values were reviewed with the patient including screening of hyperlipidemia PSA renal function and hepatic function.   There medications past medical history social history problem list and allergies were reviewed in detail.   Goals  were established with regard to weight loss exercise diet in compliance with medications Refer to urology for hematuria Change vytorin due to formulary changes

## 2013-10-05 NOTE — Patient Instructions (Signed)
The patient is instructed to continue all medications as prescribed. Schedule followup with check out clerk upon leaving the clinic  

## 2013-10-05 NOTE — Progress Notes (Signed)
Pre visit review using our clinic review tool, if applicable. No additional management support is needed unless otherwise documented below in the visit note. 

## 2013-10-08 ENCOUNTER — Encounter: Payer: BC Managed Care – PPO | Admitting: Internal Medicine

## 2013-12-21 DIAGNOSIS — Z9621 Cochlear implant status: Secondary | ICD-10-CM | POA: Insufficient documentation

## 2014-03-28 ENCOUNTER — Other Ambulatory Visit: Payer: Self-pay | Admitting: Internal Medicine

## 2014-04-05 ENCOUNTER — Ambulatory Visit: Payer: BC Managed Care – PPO | Admitting: Internal Medicine

## 2014-08-28 ENCOUNTER — Telehealth: Payer: Self-pay | Admitting: Internal Medicine

## 2014-08-28 MED ORDER — AMLODIPINE BESYLATE 5 MG PO TABS: 5.0000 mg | ORAL_TABLET | Freq: Every day | ORAL | Status: DC

## 2014-08-28 NOTE — Telephone Encounter (Signed)
[place], [place] [phone] is requesting re-fill on amLODipine (NORVASC) 5 MG tablet Pt is scheduled to see Dr. Yong Channel in January

## 2014-08-28 NOTE — Telephone Encounter (Signed)
Medication refilled

## 2014-10-09 ENCOUNTER — Other Ambulatory Visit: Payer: BC Managed Care – PPO

## 2014-10-16 ENCOUNTER — Encounter: Payer: Self-pay | Admitting: Family Medicine

## 2014-10-16 ENCOUNTER — Ambulatory Visit (INDEPENDENT_AMBULATORY_CARE_PROVIDER_SITE_OTHER): Payer: BLUE CROSS/BLUE SHIELD | Admitting: Family Medicine

## 2014-10-16 VITALS — BP 128/82 | Temp 97.9°F | Wt 150.0 lb

## 2014-10-16 DIAGNOSIS — N183 Chronic kidney disease, stage 3 unspecified: Secondary | ICD-10-CM

## 2014-10-16 DIAGNOSIS — I10 Essential (primary) hypertension: Secondary | ICD-10-CM

## 2014-10-16 DIAGNOSIS — Z0000 Encounter for general adult medical examination without abnormal findings: Secondary | ICD-10-CM

## 2014-10-16 DIAGNOSIS — Z23 Encounter for immunization: Secondary | ICD-10-CM

## 2014-10-16 DIAGNOSIS — J309 Allergic rhinitis, unspecified: Secondary | ICD-10-CM | POA: Insufficient documentation

## 2014-10-16 DIAGNOSIS — IMO0001 Reserved for inherently not codable concepts without codable children: Secondary | ICD-10-CM

## 2014-10-16 LAB — CBC
HEMATOCRIT: 47 % (ref 39.0–52.0)
HEMOGLOBIN: 15.8 g/dL (ref 13.0–17.0)
MCHC: 33.7 g/dL (ref 30.0–36.0)
MCV: 85.9 fl (ref 78.0–100.0)
Platelets: 235 10*3/uL (ref 150.0–400.0)
RBC: 5.47 Mil/uL (ref 4.22–5.81)
RDW: 12.9 % (ref 11.5–15.5)
WBC: 9.8 10*3/uL (ref 4.0–10.5)

## 2014-10-16 LAB — LIPID PANEL
Cholesterol: 157 mg/dL (ref 0–200)
HDL: 47.8 mg/dL (ref >39.00)
LDL CALC: 79 mg/dL (ref 0–99)
NonHDL: 109.2
Total CHOL/HDL Ratio: 3
Triglycerides: 150 mg/dL — ABNORMAL HIGH (ref 0.0–149.0)
VLDL: 30 mg/dL (ref 0.0–40.0)

## 2014-10-16 LAB — COMPREHENSIVE METABOLIC PANEL
ALT: 21 U/L (ref 0–53)
AST: 20 U/L (ref 0–37)
Albumin: 4.5 g/dL (ref 3.5–5.2)
Alkaline Phosphatase: 76 U/L (ref 39–117)
BUN: 17 mg/dL (ref 6–23)
CHLORIDE: 106 meq/L (ref 96–112)
CO2: 26 mEq/L (ref 19–32)
Calcium: 9.9 mg/dL (ref 8.4–10.5)
Creatinine, Ser: 1.26 mg/dL (ref 0.40–1.50)
GFR: 60.56 mL/min (ref >60.00)
Glucose, Bld: 105 mg/dL — ABNORMAL HIGH (ref 70–99)
Potassium: 4.5 mEq/L (ref 3.5–5.1)
Sodium: 140 mEq/L (ref 135–145)
Total Bilirubin: 1 mg/dL (ref 0.2–1.2)
Total Protein: 7.3 g/dL (ref 6.0–8.3)

## 2014-10-16 LAB — POCT URINALYSIS DIPSTICK
Bilirubin, UA: NEGATIVE
Glucose, UA: NEGATIVE
KETONES UA: NEGATIVE
LEUKOCYTES UA: NEGATIVE
Nitrite, UA: NEGATIVE
PH UA: 5.5
Protein, UA: NEGATIVE
SPEC GRAV UA: 1.02
UROBILINOGEN UA: 0.2

## 2014-10-16 LAB — TSH: TSH: 6.15 u[IU]/mL — ABNORMAL HIGH (ref 0.35–4.50)

## 2014-10-16 MED ORDER — LISINOPRIL 20 MG PO TABS: 20.0000 mg | ORAL_TABLET | Freq: Every day | ORAL | Status: DC

## 2014-10-16 MED ORDER — EZETIMIBE-SIMVASTATIN 10-40 MG PO TABS: 1.0000 | ORAL_TABLET | Freq: Every day | ORAL | Status: DC

## 2014-10-16 NOTE — Patient Instructions (Addendum)
You have some mild age related changes to your kidneys ? If related to old kidney stones as well. We are going to switch you to a medicine that protects your kidneys. Finish your amlodipine then switch to liisnopril next day. Take 1/2 tab only for 2 weeks, then see me in the office and we will check blood pressure and kidney function  1.  Patient counseled regarding regular dental exams, wearing seatbelts, wearing sunscreen, regular eye exams.  2. Risk factor reduction:  Advised patient of need for regular exercise and diet rich and fruits and vegetables to reduce risk of heart attack and stroke.   3. Tdap today (tetanus)  4. You opted for no shingles vaccine or prostate cancer screening. We can always do those next year if you desire.   5. Update your labs today fasting

## 2014-10-16 NOTE — Progress Notes (Signed)
Bryan Reddish, MD Phone: [phone]  Subjective:  Patient presents today for their annual physical and to establish care with me as a former patient of Dr. Arnoldo Morale.  Chief complaint-noted.   Hypertension-controlled on amlodipine CKD Stage II-III (GFR right on border 60) and not on ace I BP Readings from Last 3 Encounters:  10/16/14 128/82  10/05/13 100/70  02/02/13 128/74  Compliant with medications-yes without side effects ROS-Denies any CP, HA, SOB, blurry vision, LE edema  ROS- full  review of systems was completed and negative except for: nocturia 0-1x a night. Pertinent negatives above  The following were reviewed and entered/updated in epic: Past Medical History  Diagnosis Date  . Hyperlipidemia   . Diverticulosis   . Hearing loss of right ear   . Hypertension   . B12 deficiency anemia   . Hearing loss in left ear     essentially nonfunctioning cochlear implant   Patient Active Problem List   Diagnosis Date Noted  . CKD (chronic kidney disease), stage III 11/16/2011    Priority: Medium  . Essential hypertension 01/25/2008    Priority: Medium  . Hyperlipidemia 05/03/2007    Priority: Medium  . Allergic rhinitis 10/16/2014    Priority: Low  . NEPHROLITHIASIS, RECURRENT 04/30/2010    Priority: Low  . Vitamin B12 deficiency neuropathy 11/01/2008    Priority: Low   Past Surgical History  Procedure Laterality Date  . Kidney stone surgery    . Colonoscopy    . Cochlear implant      left  . Skin cancer removal      2010-dermatology (sees derm regularly)    Family History  Problem Relation Age of Onset  . Breast cancer Mother   . Diabetes Mother   . Hypertension Father   . Heart disease Father     lieftime smoker  . COPD Father     Medications- reviewed and updated Current Outpatient Prescriptions  Medication Sig Dispense Refill  . amLODipine (NORVASC) 5 MG tablet Take 1 tablet (5 mg total) by mouth daily. 90 tablet 3  . aspirin 81 MG tablet Take  81 mg by mouth daily.      Marland Kitchen atorvastatin (LIPITOR) 20 MG tablet Take 1 tablet (20 mg total) by mouth daily. 90 tablet 3  . cetirizine (ZYRTEC) 10 MG tablet Take 10 mg by mouth daily.      . Cholecalciferol (VITAMIN D3) 2000 UNITS TABS Take 2,000 Units by mouth daily.      Marland Kitchen ezetimibe (ZETIA) 10 MG tablet Take 1 tablet (10 mg total) by mouth daily. 90 tablet 3  . FOLBIC 2.5-25-2 MG TABS TAKE 1 TABLET DAILY 90 tablet 2  . niacin 500 MG CR capsule Take 500 mg by mouth at bedtime.       Allergies-reviewed and updated No Known Allergies  History   Social History  . Marital Status: Married    Spouse Name: N/A    Number of Children: N/A  . Years of Education: N/A   Social History Main Topics  . Smoking status: Never Smoker   . Smokeless tobacco: None  . Alcohol Use: 0.0 oz/week    0 Not specified per week     Comment: 1 a month  . Drug Use: No  . Sexual Activity: Yes   Other Topics Concern  . None   Social History Narrative   Married (wife Baker Janus patient of dr. Yong Channel). 3 children -2 twin girls and son, 59 grandchildren      Working  at [employer].       Hobbies: formerly played tennis and runs now mostly grandkids time and yardwork    ROS--See HPI   Objective: BP 128/82 mmHg  Temp(Src) 97.9 F (36.6 C)  Wt 150 lb (68.04 kg) Gen: NAD, resting comfortably, wears glasses HEENT: Mucous membranes are moist. Oropharynx normal. Good dentition Neck: no thyromegaly CV: RRR no murmurs rubs or gallops Lungs: CTAB no crackles, wheeze, rhonchi Abdomen: soft/nontender/nondistended/normal bowel sounds. No rebound or guarding.  Ext: no edema, 2+ PT pulses Skin: warm, dry, norash (full skin exam per dermatology) Neuro: grossly normal, moves all extremities, PERRLA, normal gait   Assessment/Plan:  69 y.o. male presenting for annual physical.  Health Maintenance counseling: 1. Anticipatory guidance: Patient counseled regarding regular dental exams, wearing seatbelts, wearing sunscreen 2.  Risk factor reduction:  Advised patient of need for regular exercise and diet rich and fruits and vegetables to reduce risk of heart attack and stroke.  3. Immunizations/screenings/ancillary studies-declines shingles indefinitely (postponed 5 years in [facility])  CKD (chronic kidney disease), stage III Change to lisinopril 10mg  (1/2 20mg  pill) for renal protection. See HTN   Essential hypertension Amlodipine 5mg -->lisinopril 10mg  (1/2 20mg  pill) due to CKD. Has 60 days left amlodipine and will return 2 weeks after switch to check BP and repeat Cr to make sure less than 30% increase.    Return precautions advised. 1 year other than BP/Cr check 2 weeks after change to lisinopril. Repeat UA next visit and check microscopic. Also check repeat TSH  Lab results fasting Results for orders placed or performed in visit on 10/16/14 (from the past 24 hour(s))  CBC     Status: None   Collection Time: 10/16/14 10:55 AM  Result Value Ref Range   WBC 9.8 4.0 - 10.5 K/uL   RBC 5.47 4.22 - 5.81 Mil/uL   Platelets 235.0 150.0 - 400.0 K/uL   Hemoglobin 15.8 13.0 - 17.0 g/dL   HCT 47.0 39.0 - 52.0 %   MCV 85.9 78.0 - 100.0 fl   MCHC 33.7 30.0 - 36.0 g/dL   RDW 12.9 11.5 - 15.5 %  Comprehensive metabolic panel     Status: Abnormal   Collection Time: 10/16/14 10:55 AM  Result Value Ref Range   Sodium 140 135 - 145 mEq/L   Potassium 4.5 3.5 - 5.1 mEq/L   Chloride 106 96 - 112 mEq/L   CO2 26 19 - 32 mEq/L   Glucose, Bld 105 (H) 70 - 99 mg/dL   BUN 17 6 - 23 mg/dL   Creatinine, Ser 1.26 0.40 - 1.50 mg/dL   Total Bilirubin 1.0 0.2 - 1.2 mg/dL   Alkaline Phosphatase 76 39 - 117 U/L   AST 20 0 - 37 U/L   ALT 21 0 - 53 U/L   Total Protein 7.3 6.0 - 8.3 g/dL   Albumin 4.5 3.5 - 5.2 g/dL   Calcium 9.9 8.4 - 10.5 mg/dL   GFR 60.56 >60.00 mL/min  Lipid panel     Status: Abnormal   Collection Time: 10/16/14 10:55 AM  Result Value Ref Range   Cholesterol 157 0 - 200 mg/dL   Triglycerides 150.0 (H) 0.0 - 149.0  mg/dL   HDL 47.80 >39.00 mg/dL   VLDL 30.0 0.0 - 40.0 mg/dL   LDL Cholesterol 79 0 - 99 mg/dL   Total CHOL/HDL Ratio 3    NonHDL 109.20   TSH     Status: Abnormal   Collection Time: 10/16/14 10:55 AM  Result  Value Ref Range   TSH 6.15 (H) 0.35 - 4.50 uIU/mL  POCT urinalysis dipstick     Status: None   Collection Time: 10/16/14 11:15 AM  Result Value Ref Range   Color, UA yellow    Clarity, UA clear    Glucose, UA n    Bilirubin, UA n    Ketones, UA n    Spec Grav, UA 1.020    Blood, UA 1+    pH, UA 5.5    Protein, UA n    Urobilinogen, UA 0.2    Nitrite, UA n    Leukocytes, UA Negative   Instructions to patient-Urine was largely normal but showed possible blood. At follow up visit we will check again.  Your CBC was normal (blood counts, infection fighting cells, platelets). Your CMET (kidney, liver, and electrolytes, blood sugar) was largely  normal. Slight elevation in blood sugar means you are at risk but do not have diabetes. Regular exercise and eating well is important. Kidney function largely stable.  Your thyroid test was very slightly off. Let's plan on a repeat check at next visit with add on test if needed.  Your cholesterol overall looked good with bad cholesterol at 79 less than goal 100. Continue vytorin.    Orders Placed This Encounter  Procedures  . Tdap vaccine greater than or equal to 7yo IM   Meds ordered this encounter  Medications  . ezetimibe-simvastatin (VYTORIN) 10-40 MG per tablet    Sig: Take 1 tablet by mouth daily.    Dispense:  90 tablet    Refill:  3  . lisinopril (PRINIVIL,ZESTRIL) 20 MG tablet    Sig: Take 1 tablet (20 mg total) by mouth daily. Take for 2 weeks only 1/2 tab, then see Dr. Yong Channel in office    Dispense:  90 tablet    Refill:  3

## 2014-10-17 NOTE — Addendum Note (Signed)
Addended by: Marin Olp on: 10/17/2014 07:25 AM   Modules accepted: Level of Service

## 2014-10-17 NOTE — Assessment & Plan Note (Signed)
Change to lisinopril 10mg  (1/2 20mg  pill) for renal protection. See HTN

## 2014-10-17 NOTE — Assessment & Plan Note (Signed)
Amlodipine 5mg -->lisinopril 10mg  (1/2 20mg  pill) due to CKD. Has 60 days left amlodipine and will return 2 weeks after switch to check BP and repeat Cr to make sure less than 30% increase.

## 2014-10-21 ENCOUNTER — Telehealth: Payer: Self-pay

## 2014-10-21 NOTE — Telephone Encounter (Signed)
Change to atorvastatin 40mg  #90 3 refills and make patient aware please

## 2014-10-21 NOTE — Telephone Encounter (Signed)
Melissa parmacist from [employer] called regarding Vytorin that the pt is on and this medication is not one of the perferred for the formulary. She would like to know if pt can be switched to Pravastatin, Simvistatin or Atorvastatin. Please advise.

## 2014-10-22 MED ORDER — ATORVASTATIN CALCIUM 40 MG PO TABS: 40.0000 mg | ORAL_TABLET | Freq: Every day | ORAL | Status: DC

## 2014-10-22 NOTE — Telephone Encounter (Signed)
Pt called back and made aware.

## 2014-10-22 NOTE — Telephone Encounter (Signed)
LM on pt VM TCB.Marland Kitchenmedication sent in

## 2015-01-03 ENCOUNTER — Other Ambulatory Visit: Payer: Self-pay | Admitting: *Deleted

## 2015-01-03 MED ORDER — FA-PYRIDOXINE-CYANOCOBALAMIN 2.5-25-2 MG PO TABS: 1.0000 | ORAL_TABLET | Freq: Every day | ORAL | Status: DC

## 2015-01-27 ENCOUNTER — Telehealth: Payer: Self-pay | Admitting: Family Medicine

## 2015-01-27 ENCOUNTER — Telehealth: Payer: Self-pay

## 2015-01-27 DIAGNOSIS — R7989 Other specified abnormal findings of blood chemistry: Secondary | ICD-10-CM

## 2015-01-27 DIAGNOSIS — I10 Essential (primary) hypertension: Secondary | ICD-10-CM

## 2015-01-27 NOTE — Telephone Encounter (Signed)
I saw the f/u but patient is stating he is to labs drawn before the visit, which is not noted.  Patient stated is to have his cholesterol checked before the f/u appointment.

## 2015-01-27 NOTE — Telephone Encounter (Signed)
He does not need these before visit but since he desires to have them: TSH under elevated TSH BMET under hypertension

## 2015-01-27 NOTE — Telephone Encounter (Signed)
Labs entered [place], pt can schedule now. Thanks

## 2015-01-27 NOTE — Telephone Encounter (Signed)
lmom for pt

## 2015-01-27 NOTE — Telephone Encounter (Signed)
Patient states he is to have labs drawn and then rov.  I read the notes from his 10/16/14 visit but do not see what patient is requesting.  Please advise how patient is to f/u with Dr. Yong Channel.  If patient is to have labs before an office visit, can you please enter the orders?

## 2015-01-27 NOTE — Telephone Encounter (Signed)
Does not need cholesterol checked and insurance likely would not pay for it

## 2015-01-27 NOTE — Telephone Encounter (Signed)
Bryan Carson, the pt is needing to f/u on BP its at the end of Dr. Yong Channel note from the [facility] on 10/16/14.    Dr. Yong Channel you wanted pt to have a repeat TSH b/c his thyroid was a litle off and to recheck Cr, not sure what Cr is. What do i order these under?

## 2015-01-28 NOTE — Telephone Encounter (Signed)
Medication refilled

## 2015-01-30 ENCOUNTER — Other Ambulatory Visit (INDEPENDENT_AMBULATORY_CARE_PROVIDER_SITE_OTHER): Payer: BLUE CROSS/BLUE SHIELD

## 2015-01-30 DIAGNOSIS — I10 Essential (primary) hypertension: Secondary | ICD-10-CM

## 2015-01-30 DIAGNOSIS — R946 Abnormal results of thyroid function studies: Secondary | ICD-10-CM

## 2015-01-30 DIAGNOSIS — R7989 Other specified abnormal findings of blood chemistry: Secondary | ICD-10-CM

## 2015-01-30 LAB — BASIC METABOLIC PANEL
BUN: 15 mg/dL (ref 6–23)
CO2: 31 mEq/L (ref 19–32)
Calcium: 10 mg/dL (ref 8.4–10.5)
Chloride: 104 mEq/L (ref 96–112)
Creatinine, Ser: 1.3 mg/dL (ref 0.40–1.50)
GFR: 58.36 mL/min — ABNORMAL LOW (ref >60.00)
GLUCOSE: 98 mg/dL (ref 70–99)
Potassium: 4.9 mEq/L (ref 3.5–5.1)
SODIUM: 139 meq/L (ref 135–145)

## 2015-01-30 LAB — TSH: TSH: 4.52 u[IU]/mL — ABNORMAL HIGH (ref 0.35–4.50)

## 2015-09-08 ENCOUNTER — Other Ambulatory Visit: Payer: BLUE CROSS/BLUE SHIELD

## 2015-09-08 ENCOUNTER — Ambulatory Visit: Payer: BLUE CROSS/BLUE SHIELD | Admitting: Genetic Counselor

## 2015-09-08 ENCOUNTER — Encounter: Payer: Self-pay | Admitting: Genetic Counselor

## 2015-09-08 DIAGNOSIS — Z803 Family history of malignant neoplasm of breast: Secondary | ICD-10-CM | POA: Insufficient documentation

## 2015-09-08 NOTE — Progress Notes (Signed)
REFERRING PROVIDER: Marin Olp, MD [place], [place] [address]  PRIMARY PROVIDER:  Garret Reddish, MD  PRIMARY REASON FOR VISIT:  1. Family history of breast cancer      HISTORY OF PRESENT ILLNESS:   [name], a 69 y.o. male, was seen for a Bryan Carson cancer genetics consultation at the request of Bryan Carson due to a family history of cancer.  Bryan Carson presents to clinic today to discuss the possibility of a hereditary predisposition to cancer, genetic testing, and to further clarify his future cancer risks, as well as potential cancer risks for family members. Bryan Carson is a 69 y.o. male with no personal history of cancer.  His daughter had genetic testing based on her diagnosis of breast cancer at age 75 and was found to have a PALB2 mutation.    CANCER HISTORY:   No history exists.      Past Medical History  Diagnosis Date  . Hyperlipidemia   . Diverticulosis   . Hearing loss of right ear   . Hypertension   . B12 deficiency anemia   . Hearing loss in left ear     essentially nonfunctioning cochlear implant  . Family history of breast cancer     Past Surgical History  Procedure Laterality Date  . Kidney stone surgery    . Colonoscopy    . Cochlear implant      left  . Skin cancer removal      2010-dermatology (sees derm regularly)    Social History   Social History  . Marital Status: Married    Spouse Name: N/A  . Number of Children: 3  . Years of Education: N/A   Social History Main Topics  . Smoking status: Never Smoker   . Smokeless tobacco: None  . Alcohol Use: 0.0 oz/week    0 Standard drinks or equivalent per week     Comment: 1 a month  . Drug Use: No  . Sexual Activity: Yes   Other Topics Concern  . None   Social History Narrative   Married (wife Bryan Carson patient of Bryan Carson). 3 children -2 twin girls and son, 77 grandchildren      Working at [employer].       Hobbies: formerly played tennis and runs now mostly grandkids  time and yardwork     FAMILY HISTORY:  We obtained a detailed, 4-generation family history.  Significant diagnoses are listed below: Family History  Problem Relation Age of Onset  . Breast cancer Mother     dx in her 2s  . Diabetes Mother   . Hypertension Father   . Heart disease Father     lieftime smoker  . COPD Father   . Cancer Maternal Aunt     1 maternal aunt with cancer NOS in her late 17s  . Cancer Maternal Uncle     2 maternal uncles with cancer NOS  . Cancer Maternal Grandfather     cancer NOS  . Hodgkin's lymphoma Cousin     The patient has Bryan children.  One daughter has breast cancer based on a PALB2 mutation.  The patient's sister is cancer free at 76.  His mother had bresat cancer in her 23s and died at 68, and his father died at 30 from CHF.  HIs mother had two full brothers, one full sister and two paternal half sisters.  Both brothers had an unknown form of cancer and one half sister died of an  unknown cancer.  The maternal grandfather had an unknown form of cancer.  The patient's father had a twin brother who had an intestinal cancer and died in his 68s.  There is no other cancer on this side of the family.  Patient's maternal ancestors are of Caucasian descent, and paternal ancestors are of Caucasian descent. There is no reported Ashkenazi Jewish ancestry. There is no known consanguinity.  GENETIC COUNSELING ASSESSMENT: Bryan Carson is a 69 y.o. male with a family history of breast cancer and a PALB2 mutation which is somewhat suggestive of a hereditary cancer syndrome and predisposition to cancer. We, therefore, discussed and recommended the following at today's visit.   DISCUSSION: We discussed that he has an approximately 50% risk for testing positive for the PALB2 mutation his daughter has.  We discussed that his wife is negative, so with cascade testing we are looking at his side of the family.  We reviewed the characteristics, features and inheritance patterns  of hereditary cancer syndromes. We also discussed genetic testing, including the appropriate family members to test, the process of testing, insurance coverage and turn-around-time for results. We discussed the implications of a negative, positive and/or variant of uncertain significant result. We recommended Bryan Carson pursue genetic testing for the targeted PALB2 familial mutation.   Based on Bryan Carson's family history of cancer and a known familial mutation in Bryan Carson, he meets medical criteria for genetic testing. Despite that he meets criteria, he may still have an out of pocket cost. We discussed that if his out of pocket cost for testing is over $100, the laboratory will call and confirm whether he wants to proceed with testing.  If the out of pocket cost of testing is less than $100 he will be billed by the genetic testing laboratory.   PLAN: After considering the risks, benefits, and limitations, Bryan Carson  provided informed consent to pursue genetic testing and the blood sample was sent to Bryan Carson for analysis of the Bryan Carson in PALB2 . Results should be available within approximately 7-10 days' time, at which point they will be disclosed by telephone to Bryan Carson, as will any additional recommendations warranted by these results. Bryan Carson will receive a summary of his genetic counseling visit and a copy of his results once available. This information will also be available in Epic. We encouraged Bryan Carson to remain in contact with cancer genetics annually so that we can continuously update the family history and inform him of any changes in cancer genetics and testing that may be of benefit for his family. Bryan Carson questions were answered to his satisfaction today. Our contact information was provided should additional questions or concerns arise.  Lastly, we encouraged Bryan Carson to remain in contact with cancer genetics annually so that we can continuously update the family history and  inform him of any changes in cancer genetics and testing that may be of benefit for this family.   Bryan Carson questions were answered to his satisfaction today. Our contact information was provided should additional questions or concerns arise. Thank you for the referral and allowing [place] to share in the care of your patient.   Karen P. Florene Glen, [place], [facility] Certified Genetic Counselor [email or web address] phone: [phone]  The patient was seen for a total of 30 minutes in face-to-face genetic counseling.  This patient was discussed with Drs. Magrinat, Lindi Adie and/or Burr Medico who agrees with the above.    _______________________________________________________________________ For Office Staff:  Number of people involved  in session: 2 Was an Intern/ student involved with case: no

## 2015-09-26 ENCOUNTER — Encounter: Payer: Self-pay | Admitting: Genetic Counselor

## 2015-09-26 ENCOUNTER — Telehealth: Payer: Self-pay | Admitting: Genetic Counselor

## 2015-09-26 DIAGNOSIS — Z1501 Genetic susceptibility to malignant neoplasm of breast: Secondary | ICD-10-CM | POA: Insufficient documentation

## 2015-09-26 DIAGNOSIS — Z1589 Genetic susceptibility to other disease: Secondary | ICD-10-CM

## 2015-09-26 DIAGNOSIS — Z1509 Genetic susceptibility to other malignant neoplasm: Secondary | ICD-10-CM

## 2015-09-26 NOTE — Telephone Encounter (Signed)
Revealed that the [facility] in [place] was found.  They will come in either next Wed or Monday 16th. Baker Janus, his wife, will call him and find out which one is more convenient.

## 2015-09-29 ENCOUNTER — Encounter: Payer: BLUE CROSS/BLUE SHIELD | Admitting: Genetic Counselor

## 2015-10-01 ENCOUNTER — Encounter: Payer: BLUE CROSS/BLUE SHIELD | Admitting: Genetic Counselor

## 2015-10-14 ENCOUNTER — Other Ambulatory Visit (INDEPENDENT_AMBULATORY_CARE_PROVIDER_SITE_OTHER): Payer: BLUE CROSS/BLUE SHIELD

## 2015-10-14 DIAGNOSIS — Z0000 Encounter for general adult medical examination without abnormal findings: Secondary | ICD-10-CM | POA: Diagnosis not present

## 2015-10-14 LAB — BASIC METABOLIC PANEL
BUN: 14 mg/dL (ref 6–23)
CO2: 30 mEq/L (ref 19–32)
Calcium: 9.3 mg/dL (ref 8.4–10.5)
Chloride: 104 mEq/L (ref 96–112)
Creatinine, Ser: 1.34 mg/dL (ref 0.40–1.50)
GFR: 56.24 mL/min — AB (ref >60.00)
GLUCOSE: 96 mg/dL (ref 70–99)
POTASSIUM: 4.5 meq/L (ref 3.5–5.1)
SODIUM: 141 meq/L (ref 135–145)

## 2015-10-14 LAB — PSA: PSA: 1.43 ng/mL (ref 0.10–4.00)

## 2015-10-14 LAB — TSH: TSH: 3.81 u[IU]/mL (ref 0.35–4.50)

## 2015-10-14 LAB — LIPID PANEL
CHOL/HDL RATIO: 3
Cholesterol: 144 mg/dL (ref 0–200)
HDL: 44.7 mg/dL (ref >39.00)
LDL CALC: 61 mg/dL (ref 0–99)
NonHDL: 99.21
TRIGLYCERIDES: 190 mg/dL — AB (ref 0.0–149.0)
VLDL: 38 mg/dL (ref 0.0–40.0)

## 2015-10-14 LAB — HEPATIC FUNCTION PANEL
ALBUMIN: 4 g/dL (ref 3.5–5.2)
ALT: 18 U/L (ref 0–53)
AST: 18 U/L (ref 0–37)
Alkaline Phosphatase: 77 U/L (ref 39–117)
Bilirubin, Direct: 0.1 mg/dL (ref 0.0–0.3)
Total Bilirubin: 1 mg/dL (ref 0.2–1.2)
Total Protein: 6.2 g/dL (ref 6.0–8.3)

## 2015-10-14 LAB — POCT URINALYSIS DIPSTICK
BILIRUBIN UA: NEGATIVE
GLUCOSE UA: NEGATIVE
Ketones, UA: NEGATIVE
Leukocytes, UA: NEGATIVE
Nitrite, UA: NEGATIVE
Protein, UA: NEGATIVE
SPEC GRAV UA: 1.015
Urobilinogen, UA: 0.2
pH, UA: 7

## 2015-10-14 LAB — CBC WITH DIFFERENTIAL/PLATELET
Basophils Absolute: 0.1 10*3/uL (ref 0.0–0.1)
Basophils Relative: 0.7 % (ref 0.0–3.0)
EOS PCT: 8.9 % — AB (ref 0.0–5.0)
Eosinophils Absolute: 0.7 10*3/uL (ref 0.0–0.7)
HEMATOCRIT: 44.7 % (ref 39.0–52.0)
HEMOGLOBIN: 14.4 g/dL (ref 13.0–17.0)
LYMPHS ABS: 2.2 10*3/uL (ref 0.7–4.0)
Lymphocytes Relative: 26.2 % (ref 12.0–46.0)
MCHC: 32.3 g/dL (ref 30.0–36.0)
MCV: 87.6 fl (ref 78.0–100.0)
MONOS PCT: 5 % (ref 3.0–12.0)
Monocytes Absolute: 0.4 10*3/uL (ref 0.1–1.0)
Neutro Abs: 4.9 10*3/uL (ref 1.4–7.7)
Neutrophils Relative %: 59.2 % (ref 43.0–77.0)
Platelets: 222 10*3/uL (ref 150.0–400.0)
RBC: 5.1 Mil/uL (ref 4.22–5.81)
RDW: 13.9 % (ref 11.5–15.5)
WBC: 8.3 10*3/uL (ref 4.0–10.5)

## 2015-10-14 LAB — URINALYSIS, MICROSCOPIC ONLY

## 2015-10-14 NOTE — Addendum Note (Signed)
Addended by: Gari Crown D on: 10/14/2015 01:16 PM   Modules accepted: Orders

## 2015-10-15 ENCOUNTER — Other Ambulatory Visit: Payer: Self-pay | Admitting: Family Medicine

## 2015-10-21 ENCOUNTER — Encounter: Payer: Self-pay | Admitting: Family Medicine

## 2015-10-21 ENCOUNTER — Ambulatory Visit (INDEPENDENT_AMBULATORY_CARE_PROVIDER_SITE_OTHER): Payer: BLUE CROSS/BLUE SHIELD | Admitting: Family Medicine

## 2015-10-21 VITALS — BP 132/70 | HR 74 | Temp 98.8°F | Wt 154.0 lb

## 2015-10-21 DIAGNOSIS — Z0000 Encounter for general adult medical examination without abnormal findings: Secondary | ICD-10-CM | POA: Diagnosis not present

## 2015-10-21 MED ORDER — LISINOPRIL 20 MG PO TABS: 20.0000 mg | ORAL_TABLET | Freq: Every day | ORAL | Status: DC

## 2015-10-21 MED ORDER — FA-PYRIDOXINE-CYANOCOBALAMIN 2.5-25-2 MG PO TABS: 1.0000 | ORAL_TABLET | Freq: Every day | ORAL | Status: DC

## 2015-10-21 MED ORDER — ATORVASTATIN CALCIUM 40 MG PO TABS
40.0000 mg | ORAL_TABLET | Freq: Every day | ORAL | Status: DC
Start: 2015-10-21 — End: 2016-10-22

## 2015-10-21 NOTE — Patient Instructions (Addendum)
Please call to schedule your colonoscopy with Dr. Melina Copa  Since we are not going to see you for a year, I want you to check your blood pressure at least once a month and make sure <140/90. This is important to help maintain your kidney health (stable for several years and monitoring each year)  When you get labs next year- ask them to add hepatitis C  No changes in medications

## 2015-10-21 NOTE — Progress Notes (Signed)
Bryan Hunter, MD Phone: [phone]  Subjective:  Patient presents today for their annual physical. Chief complaint-noted.   See problem oriented charting- ROS- full  review of systems was completed and negative except for: No chest pain or shortness of breath. No headache or blurry vision. Does have known hearing loss and is following with ENT  The following were reviewed and entered/updated in epic: Past Medical History  Diagnosis Date  . Hyperlipidemia   . Diverticulosis   . Hearing loss of right ear   . Hypertension   . B12 deficiency anemia   . Hearing loss in left ear     essentially nonfunctioning cochlear implant  . Family history of breast cancer    Patient Active Problem List   Diagnosis Date Noted  . CKD (chronic kidney disease), stage III 11/16/2011    Priority: Medium  . Essential hypertension 01/25/2008    Priority: Medium  . Hyperlipidemia 05/03/2007    Priority: Medium  . Allergic rhinitis 10/16/2014    Priority: Low  . NEPHROLITHIASIS, RECURRENT 04/30/2010    Priority: Low  . Vitamin B12 deficiency neuropathy 11/01/2008    Priority: Low  . Genetic testing 09/26/2015  . Family history of breast cancer    Past Surgical History  Procedure Laterality Date  . Kidney stone surgery    . Colonoscopy    . Cochlear implant      left  . Skin cancer removal      2010-dermatology (sees derm regularly)    Family History  Problem Relation Age of Onset  . Breast cancer Mother     dx in her 60s  . Diabetes Mother   . Hypertension Father   . Heart disease Father     lieftime smoker  . COPD Father   . Cancer Maternal Aunt     1 maternal aunt with cancer NOS in her late 40s  . Cancer Maternal Uncle     2 maternal uncles with cancer NOS  . Cancer Maternal Grandfather     cancer NOS  . Hodgkin's lymphoma Cousin     Medications- reviewed and updated Current Outpatient Prescriptions  Medication Sig Dispense Refill  . aspirin 81 MG tablet Take 81 mg  by mouth daily.      . cetirizine (ZYRTEC) 10 MG tablet Take 10 mg by mouth daily.      . Cholecalciferol (VITAMIN D3) 2000 UNITS TABS Take 2,000 Units by mouth daily.      . folic acid-pyridoxine-cyancobalamin (FOLBIC) 2.5-25-2 MG TABS Take 1 tablet by mouth daily. 90 tablet 3  . lisinopril (PRINIVIL,ZESTRIL) 20 MG tablet Take 1 tablet (20 mg total) by mouth daily. Take for 2 weeks only 1/2 tab, then see Dr. Hunter in office 90 tablet 3  . niacin 500 MG CR capsule Take 500 mg by mouth at bedtime.      . atorvastatin (LIPITOR) 40 MG tablet Take 1 tablet (40 mg total) by mouth daily. 90 tablet 3   No current facility-administered medications for this visit.    Allergies-reviewed and updated No Known Allergies  Social History   Social History  . Marital Status: Married    Spouse Name: N/A  . Number of Children: 3  . Years of Education: N/A   Social History Main Topics  . Smoking status: Never Smoker   . Smokeless tobacco: None  . Alcohol Use: 0.0 oz/week    0 Standard drinks or equivalent per week     Comment: 1 a month  .   Drug Use: No  . Sexual Activity: Yes   Other Topics Concern  . None   Social History Narrative   Married (wife Baker Janus patient of dr. Yong Channel). 3 children -2 twin girls and son, 68 grandchildren      Working at [employer].       Hobbies: formerly played tennis and runs now mostly grandkids time and yardwork    ROS--See HPI   Objective: BP 132/70 mmHg  Pulse 74  Temp(Src) 98.8 F (37.1 C)  Wt 154 lb (69.854 kg) Gen: NAD, resting comfortably HEENT: Mucous membranes are moist. Oropharynx normal Neck: no thyromegaly CV: RRR no murmurs rubs or gallops Lungs: CTAB no crackles, wheeze, rhonchi Abdomen: soft/nontender/nondistended/normal bowel sounds. No rebound or guarding.  Rectal: normal tone, diffusely enlarged prostate, no masses or tenderness Ext: no edema, 2+ DP pulses Skin: warm, dry, no rash Neuro: grossly normal, moves all extremities,  PERRLA  Assessment/Plan:  70 y.o. male presenting for annual physical.  Health Maintenance counseling: 1. Anticipatory guidance: Patient counseled regarding regular dental exams, eye exams, wearing seatbelts.  2. Risk factor reduction:  Advised patient of need for regular exercise and diet rich and fruits and vegetables to reduce risk of heart attack and stroke.  3. Immunizations/screenings/ancillary studies Health Maintenance Due  Topic Date Due  . Hepatitis C Screening - agrees to with next labs Dec 09, 1945  . COLONOSCOPY - sees Dr. Melina Copa in [place]. Will call to schedule 11/04/2014   4. Prostate cancer screening- low risk PSA and rectal. PALB2 carrier- increases prostate risk screen through age 53  Lab Results  Component Value Date   PSA 1.43 10/14/2015   PSA 1.63 09/28/2013   PSA 1.53 09/22/2012   5. Colon cancer screening - needs to schedule as above and in avs 6. Skin cancer screening- [facility]  Hyperlipidemia- excellent control on atorvastatin. Also on niacin Lab Results  Component Value Date   CHOL 144 10/14/2015   HDL 44.70 10/14/2015   LDLCALC 61 10/14/2015   LDLDIRECT 65.3 01/29/2010   TRIG 190.0* 10/14/2015   CHOLHDL 3 10/14/2015   Hypertension- excellent control on lisinopril 56m BP Readings from Last 3 Encounters:  10/21/15 132/70  10/16/14 128/82  10/05/13 100/70   CKD Stage III- stable with GFR just below 60. Advised avoid nsaids- only rarely uses  Return in about 1 year (around 10/20/2016) for physical. Sooner Return precautions advised.   Meds ordered this encounter  Medications  . atorvastatin (LIPITOR) 40 MG tablet    Sig: Take 1 tablet (40 mg total) by mouth daily.    Dispense:  90 tablet    Refill:  3  . lisinopril (PRINIVIL,ZESTRIL) 20 MG tablet    Sig: Take 1 tablet (20 mg total) by mouth daily.    Dispense:  90 tablet    Refill:  3  . folic acid-pyridoxine-cyancobalamin (FOLBIC) 2.5-25-2 MG TABS  tablet    Sig: Take 1 tablet by mouth daily.    Dispense:  90 tablet    Refill:  3

## 2016-06-10 ENCOUNTER — Other Ambulatory Visit (INDEPENDENT_AMBULATORY_CARE_PROVIDER_SITE_OTHER): Payer: Self-pay | Admitting: Otolaryngology

## 2016-06-10 DIAGNOSIS — J329 Chronic sinusitis, unspecified: Secondary | ICD-10-CM

## 2016-06-10 DIAGNOSIS — J339 Nasal polyp, unspecified: Secondary | ICD-10-CM

## 2016-06-17 ENCOUNTER — Ambulatory Visit
Admission: RE | Admit: 2016-06-17 | Discharge: 2016-06-17 | Disposition: A | Discharge status: Home / Self Care | Payer: BLUE CROSS/BLUE SHIELD | Source: Ambulatory Visit | Attending: Otolaryngology | Admitting: Otolaryngology

## 2016-06-17 DIAGNOSIS — J329 Chronic sinusitis, unspecified: Secondary | ICD-10-CM

## 2016-06-17 DIAGNOSIS — J339 Nasal polyp, unspecified: Secondary | ICD-10-CM

## 2016-07-30 ENCOUNTER — Encounter (HOSPITAL_COMMUNITY): Payer: Self-pay

## 2016-10-15 ENCOUNTER — Other Ambulatory Visit (INDEPENDENT_AMBULATORY_CARE_PROVIDER_SITE_OTHER): Payer: BLUE CROSS/BLUE SHIELD

## 2016-10-15 DIAGNOSIS — Z0000 Encounter for general adult medical examination without abnormal findings: Secondary | ICD-10-CM

## 2016-10-15 DIAGNOSIS — Z125 Encounter for screening for malignant neoplasm of prostate: Secondary | ICD-10-CM

## 2016-10-15 DIAGNOSIS — E785 Hyperlipidemia, unspecified: Secondary | ICD-10-CM | POA: Diagnosis not present

## 2016-10-15 DIAGNOSIS — R319 Hematuria, unspecified: Secondary | ICD-10-CM

## 2016-10-15 LAB — BASIC METABOLIC PANEL
BUN: 21 mg/dL (ref 6–23)
CO2: 28 mEq/L (ref 19–32)
Calcium: 9.5 mg/dL (ref 8.4–10.5)
Chloride: 105 mEq/L (ref 96–112)
Creatinine, Ser: 1.3 mg/dL (ref 0.40–1.50)
GFR: 58.07 mL/min — AB (ref >60.00)
Glucose, Bld: 97 mg/dL (ref 70–99)
POTASSIUM: 4.7 meq/L (ref 3.5–5.1)
SODIUM: 140 meq/L (ref 135–145)

## 2016-10-15 LAB — POC URINALSYSI DIPSTICK (AUTOMATED)
Bilirubin, UA: NEGATIVE
GLUCOSE UA: NEGATIVE
Ketones, UA: NEGATIVE
Leukocytes, UA: NEGATIVE
NITRITE UA: NEGATIVE
PH UA: 5.5
PROTEIN UA: NEGATIVE
Spec Grav, UA: 1.02
UROBILINOGEN UA: 0.2

## 2016-10-15 LAB — CBC WITH DIFFERENTIAL/PLATELET
Basophils Absolute: 0 10*3/uL (ref 0.0–0.1)
Basophils Relative: 0.4 % (ref 0.0–3.0)
EOS PCT: 4.5 % (ref 0.0–5.0)
Eosinophils Absolute: 0.3 10*3/uL (ref 0.0–0.7)
HCT: 41.6 % (ref 39.0–52.0)
HEMOGLOBIN: 14 g/dL (ref 13.0–17.0)
Lymphocytes Relative: 30.7 % (ref 12.0–46.0)
Lymphs Abs: 2 10*3/uL (ref 0.7–4.0)
MCHC: 33.7 g/dL (ref 30.0–36.0)
MCV: 86.1 fl (ref 78.0–100.0)
MONOS PCT: 4.8 % (ref 3.0–12.0)
Monocytes Absolute: 0.3 10*3/uL (ref 0.1–1.0)
Neutro Abs: 3.8 10*3/uL (ref 1.4–7.7)
Neutrophils Relative %: 59.6 % (ref 43.0–77.0)
Platelets: 234 10*3/uL (ref 150.0–400.0)
RBC: 4.83 Mil/uL (ref 4.22–5.81)
RDW: 12.8 % (ref 11.5–15.5)
WBC: 6.4 10*3/uL (ref 4.0–10.5)

## 2016-10-15 LAB — HEPATIC FUNCTION PANEL
ALT: 15 U/L (ref 0–53)
AST: 16 U/L (ref 0–37)
Albumin: 4.1 g/dL (ref 3.5–5.2)
Alkaline Phosphatase: 92 U/L (ref 39–117)
BILIRUBIN TOTAL: 0.9 mg/dL (ref 0.2–1.2)
Bilirubin, Direct: 0.2 mg/dL (ref 0.0–0.3)
Total Protein: 6.5 g/dL (ref 6.0–8.3)

## 2016-10-15 LAB — LIPID PANEL
CHOLESTEROL: 130 mg/dL (ref 0–200)
HDL: 32.3 mg/dL — ABNORMAL LOW (ref >39.00)
LDL Cholesterol: 72 mg/dL (ref 0–99)
NonHDL: 97.44
TRIGLYCERIDES: 126 mg/dL (ref 0.0–149.0)
Total CHOL/HDL Ratio: 4
VLDL: 25.2 mg/dL (ref 0.0–40.0)

## 2016-10-15 LAB — TSH: TSH: 3.77 u[IU]/mL (ref 0.35–4.50)

## 2016-10-15 LAB — URINALYSIS, MICROSCOPIC ONLY

## 2016-10-15 LAB — PSA: PSA: 1.65 ng/mL (ref 0.10–4.00)

## 2016-10-22 ENCOUNTER — Ambulatory Visit (INDEPENDENT_AMBULATORY_CARE_PROVIDER_SITE_OTHER): Payer: BLUE CROSS/BLUE SHIELD | Admitting: Family Medicine

## 2016-10-22 ENCOUNTER — Encounter: Payer: Self-pay | Admitting: Family Medicine

## 2016-10-22 VITALS — BP 132/80 | HR 73 | Temp 98.5°F | Ht 67.75 in | Wt 152.8 lb

## 2016-10-22 DIAGNOSIS — Z0000 Encounter for general adult medical examination without abnormal findings: Secondary | ICD-10-CM | POA: Diagnosis not present

## 2016-10-22 DIAGNOSIS — G63 Polyneuropathy in diseases classified elsewhere: Secondary | ICD-10-CM | POA: Diagnosis not present

## 2016-10-22 DIAGNOSIS — E538 Deficiency of other specified B group vitamins: Secondary | ICD-10-CM | POA: Diagnosis not present

## 2016-10-22 MED ORDER — FA-PYRIDOXINE-CYANOCOBALAMIN 2.5-25-2 MG PO TABS: 1.0000 | ORAL_TABLET | Freq: Every day | ORAL | 3 refills | Status: DC

## 2016-10-22 MED ORDER — LISINOPRIL 20 MG PO TABS: 20.0000 mg | ORAL_TABLET | Freq: Every day | ORAL | 3 refills | Status: DC

## 2016-10-22 MED ORDER — ATORVASTATIN CALCIUM 40 MG PO TABS: 40.0000 mg | ORAL_TABLET | Freq: Every day | ORAL | 3 refills | Status: DC

## 2016-10-22 NOTE — Patient Instructions (Signed)
No changes in medicine. May need to change pharmacy when transition to medicare part B  Lets try to find a way to get some exercise added back in within the winter but otherwise things look great

## 2016-10-22 NOTE — Progress Notes (Signed)
Pre visit review using our clinic review tool, if applicable. No additional management support is needed unless otherwise documented below in the visit note. 

## 2016-10-22 NOTE — Progress Notes (Signed)
Phone: [phone]  Subjective:  Patient presents today for their annual physical. Chief complaint-noted.   See problem oriented charting- ROS- full  review of systems was completed and negative including No chest pain or shortness of breath. No headache (other than after sinus surgery but improving) or blurry vision.    The following were reviewed and entered/updated in epic: Past Medical History:  Diagnosis Date  . B12 deficiency anemia   . Diverticulosis   . Family history of breast cancer   . Hearing loss in left ear    essentially nonfunctioning cochlear implant  . Hearing loss of right ear   . Hyperlipidemia   . Hypertension    Patient Active Problem List   Diagnosis Date Noted  . CKD (chronic kidney disease), stage III 11/16/2011    Priority: Medium  . Essential hypertension 01/25/2008    Priority: Medium  . Hyperlipidemia 05/03/2007    Priority: Medium  . Allergic rhinitis 10/16/2014    Priority: Low  . NEPHROLITHIASIS, RECURRENT 04/30/2010    Priority: Low  . Vitamin B12 deficiency neuropathy ([place]) 11/01/2008    Priority: Low  . Genetic testing 09/26/2015  . Family history of breast cancer    Past Surgical History:  Procedure Laterality Date  . COCHLEAR IMPLANT     left  . COLONOSCOPY    . KIDNEY STONE SURGERY    . nasal polyp removal bilateral    . skin cancer removal     2010-dermatology (sees derm regularly)    Family History  Problem Relation Age of Onset  . Breast cancer Mother     dx in her 74s  . Diabetes Mother   . Hypertension Father   . Heart disease Father     lieftime smoker  . COPD Father   . Cancer Maternal Aunt     1 maternal aunt with cancer NOS in her late 48s  . Cancer Maternal Uncle     2 maternal uncles with cancer NOS  . Cancer Maternal Grandfather     cancer NOS  . Hodgkin's lymphoma Cousin     Medications- reviewed and updated Current Outpatient Prescriptions  Medication Sig Dispense Refill  . aspirin 81 MG  tablet Take 81 mg by mouth daily.      Marland Kitchen atorvastatin (LIPITOR) 40 MG tablet Take 1 tablet (40 mg total) by mouth daily. 90 tablet 3  . cetirizine (ZYRTEC) 10 MG tablet Take 10 mg by mouth daily.      . Cholecalciferol (VITAMIN D3) 2000 UNITS TABS Take 2,000 Units by mouth daily.      . folic acid-pyridoxine-cyancobalamin (FOLBIC) 2.5-25-2 MG TABS tablet Take 1 tablet by mouth daily. 90 tablet 3  . lisinopril (PRINIVIL,ZESTRIL) 20 MG tablet Take 1 tablet (20 mg total) by mouth daily. 90 tablet 3  . niacin 500 MG CR capsule Take 500 mg by mouth at bedtime.       No current facility-administered medications for this visit.     Allergies-reviewed and updated No Known Allergies  Social History   Social History  . Marital status: Married    Spouse name: N/A  . Number of children: 3  . Years of education: N/A   Social History Main Topics  . Smoking status: Never Smoker  . Smokeless tobacco: Never Used  . Alcohol use 0.0 oz/week     Comment: 1 a month  . Drug use: No  . Sexual activity: Yes   Other Topics Concern  . None   Social  History Narrative   Married (wife Baker Janus patient of dr. Yong Channel). 3 children -2 twin girls and son, 27 grandchildren      Working at [employer].       Hobbies: formerly played tennis and runs now mostly grandkids time and yardwork    Objective: BP 132/80 (BP Location: Left Arm, Patient Position: Sitting, Cuff Size: Normal)   Pulse 73   Temp 98.5 F (36.9 C) (Oral)   Ht 5' 7.75" (1.721 m)   Wt 152 lb 12.8 oz (69.3 kg)   SpO2 95%   BMI 23.40 kg/m  Gen: NAD, resting comfortably HEENT: Mucous membranes are moist. Oropharynx normal Neck: no thyromegaly, no lymphadenopathy CV: RRR no murmurs rubs or gallops Lungs: CTAB no crackles, wheeze, rhonchi Abdomen: soft/nontender/nondistended/normal bowel sounds.  Ext: no edema Skin: warm, dry Neuro: grossly normal, moves all extremities, PERRLA Rectal: normal tone, normal sized prostate, no masses or  tenderness  Assessment/Plan:  71 y.o. male presenting for annual physical.  Health Maintenance counseling: 1. Anticipatory guidance: Patient counseled regarding regular dental exams - q6 mo, eye exams -yearly, wearing seatbelts.  2. Risk factor reduction:  Advised patient of need for regular exercise and diet rich and fruits and vegetables to reduce risk of heart attack and stroke. Exercise- down in winter, may be why HDL is lower. Diet-varied, balanced- weight reasonable.  Wt Readings from Last 3 Encounters:  10/22/16 152 lb 12.8 oz (69.3 kg)  10/21/15 154 lb (69.9 kg)  10/16/14 150 lb (68 kg)   3. Immunizations/screenings/ancillary studies Immunization History  Administered Date(s) Administered  . H1N1 09/23/2008  . Hep A / Hep B 09/23/2008, 01/17/2009, 04/11/2009  . Hepatitis A 09/23/2008  . Hepatitis B 09/23/2008  . Influenza Whole 07/24/2009, 07/21/2010  . Influenza-Unspecified 07/04/2014, 07/06/2015, 07/07/2016  . Pneumococcal Conjugate-13 08/14/2013  . Pneumococcal Polysaccharide-23 06/19/2012  . Td 09/20/2001  . Tdap 10/16/2014   Health Maintenance Due  Topic Date Due  . Hepatitis C Screening - future labs, did not get added on 1946-02-09  . COLONOSCOPY -see below 11/04/2014   4. Prostate cancer screening- low risk trend and rectal exam.   Plan to screen through age 21 at least due to palb2 carrier.  Lab Results  Component Value Date   PSA 1.65 10/15/2016   PSA 1.43 10/14/2015   PSA 1.63 09/28/2013   5. Colon cancer screening - last in 2006, discussed repeat  last year. Plan was for him to call Dr. Melina Copa in [place]. He did but has to reschedule due to having plans for sinus surgery (ended up missing both due to travel)- later had nasal polyp removed .  6. Skin cancer screening- follows yearly with skin surgery center- usually around march  Status of chronic or acute concerns  HTN- controlled on 4m lisinopril  CKD III- GFR stable aroudn 60. Avoid nsaids.   HLD-  atorvastatin compliant. LDL <100 reasonable  Vitamin b12 deficiency neuropathy- resolved with b complex vitamin- on folbic  Allergies on claritin  1 year CPE since doing so well  Meds ordered this encounter  Medications  . folic acid-pyridoxine-cyancobalamin (FOLBIC) 2.5-25-2 MG TABS tablet    Sig: Take 1 tablet by mouth daily.    Dispense:  90 tablet    Refill:  3  . atorvastatin (LIPITOR) 40 MG tablet    Sig: Take 1 tablet (40 mg total) by mouth daily.    Dispense:  90 tablet    Refill:  3  . lisinopril (PRINIVIL,ZESTRIL) 20 MG tablet  Sig: Take 1 tablet (20 mg total) by mouth daily.    Dispense:  90 tablet    Refill:  3    Return precautions advised.   Garret Reddish, MD

## 2017-04-08 ENCOUNTER — Telehealth: Payer: Self-pay | Admitting: Family Medicine

## 2017-04-08 ENCOUNTER — Other Ambulatory Visit: Payer: Self-pay

## 2017-04-08 MED ORDER — ATORVASTATIN CALCIUM 40 MG PO TABS: 40.0000 mg | ORAL_TABLET | Freq: Every day | ORAL | 3 refills | Status: DC

## 2017-04-08 NOTE — Telephone Encounter (Signed)
Pt needs new rx atorvastatin 40 mg #90 w/refills send to new pharm [facility]

## 2017-04-08 NOTE — Telephone Encounter (Signed)
Prescription sent to pharmacy as requested.

## 2017-04-19 ENCOUNTER — Other Ambulatory Visit: Payer: Self-pay

## 2017-04-19 MED ORDER — ATORVASTATIN CALCIUM 40 MG PO TABS: 40.0000 mg | ORAL_TABLET | Freq: Every day | ORAL | 3 refills | Status: DC

## 2017-04-19 MED ORDER — LISINOPRIL 20 MG PO TABS: 20.0000 mg | ORAL_TABLET | Freq: Every day | ORAL | 3 refills | Status: DC

## 2017-04-19 MED ORDER — ATORVASTATIN CALCIUM 40 MG PO TABS: 40.0000 mg | ORAL_TABLET | Freq: Every day | ORAL | 0 refills | Status: DC

## 2017-04-25 ENCOUNTER — Other Ambulatory Visit: Payer: Self-pay

## 2017-04-25 MED ORDER — ATORVASTATIN CALCIUM 40 MG PO TABS: 40.0000 mg | ORAL_TABLET | Freq: Every day | ORAL | 3 refills | Status: DC

## 2017-05-18 ENCOUNTER — Other Ambulatory Visit: Payer: Self-pay | Admitting: Family Medicine

## 2017-06-29 ENCOUNTER — Telehealth: Payer: Self-pay | Admitting: Family Medicine

## 2017-06-29 MED ORDER — FA-PYRIDOXINE-CYANOCOBALAMIN 2.5-25-2 MG PO TABS: 1.0000 | ORAL_TABLET | Freq: Every day | ORAL | 1 refills | Status: DC

## 2017-06-29 NOTE — Telephone Encounter (Signed)
MEDICATION: folic acid-pyridoxine-cyancobalamin (FOLBIC) 2.5-25-2 MG TABS tablet  PHARMACY:   [facility] [place] [place] [facility] [employer] [phone] (Phone) [phone] (Fax)     IS THIS A 90 DAY SUPPLY : n  IS PATIENT OUT OF MEDICATION: n IF NOT; HOW MUCH IS LEFT: 10 days  LAST APPOINTMENT DATE: @02 /02/18  NEXT APPOINTMENT DATE:@2 /01/2018  OTHER COMMENTS:    **Let patient know to contact pharmacy at the end of the day to make sure medication is ready. **  ** Please notify patient to allow 48-72 hours to process**  **Encourage patient to contact the pharmacy for refills or they can request refills through [facility]**

## 2017-06-29 NOTE — Telephone Encounter (Signed)
Left message on voicemail Rx was sent to pharmacy as requested. 

## 2017-07-08 NOTE — Telephone Encounter (Signed)
Patients wife calling to state the pharmacy has told them they need a brand new prescription sent, not a refill. Please advise.

## 2017-07-11 ENCOUNTER — Ambulatory Visit (INDEPENDENT_AMBULATORY_CARE_PROVIDER_SITE_OTHER): Payer: Medicare Other | Admitting: *Deleted

## 2017-07-11 DIAGNOSIS — Z23 Encounter for immunization: Secondary | ICD-10-CM | POA: Diagnosis not present

## 2017-07-25 ENCOUNTER — Telehealth: Payer: Self-pay

## 2017-07-25 NOTE — Telephone Encounter (Signed)
Can you help with a Prior Authorization for the medication folic acid-pyridoxine-cyancobalamin (FOLBIC) 2.5-25-2 MG TABS tablet? The prescription has already been sent to the pharmacy and they are just waiting on a PA approval. Thank you!

## 2017-07-27 NOTE — Telephone Encounter (Signed)
Currently working on prior auth.

## 2017-08-02 NOTE — Telephone Encounter (Signed)
This has been determined not covered by patient's plan.  Prior Josem Kaufmann has been denied.

## 2017-10-25 ENCOUNTER — Encounter: Payer: Self-pay | Admitting: Family Medicine

## 2017-10-25 ENCOUNTER — Ambulatory Visit (INDEPENDENT_AMBULATORY_CARE_PROVIDER_SITE_OTHER): Payer: Medicare Other | Admitting: Family Medicine

## 2017-10-25 VITALS — BP 112/86 | HR 78 | Temp 97.6°F | Ht 67.75 in | Wt 150.2 lb

## 2017-10-25 DIAGNOSIS — G63 Polyneuropathy in diseases classified elsewhere: Secondary | ICD-10-CM

## 2017-10-25 DIAGNOSIS — N401 Enlarged prostate with lower urinary tract symptoms: Secondary | ICD-10-CM

## 2017-10-25 DIAGNOSIS — R319 Hematuria, unspecified: Secondary | ICD-10-CM | POA: Diagnosis not present

## 2017-10-25 DIAGNOSIS — Z125 Encounter for screening for malignant neoplasm of prostate: Secondary | ICD-10-CM

## 2017-10-25 DIAGNOSIS — Z1501 Genetic susceptibility to malignant neoplasm of breast: Secondary | ICD-10-CM | POA: Diagnosis not present

## 2017-10-25 DIAGNOSIS — Z0000 Encounter for general adult medical examination without abnormal findings: Secondary | ICD-10-CM | POA: Diagnosis not present

## 2017-10-25 DIAGNOSIS — E785 Hyperlipidemia, unspecified: Secondary | ICD-10-CM

## 2017-10-25 DIAGNOSIS — I10 Essential (primary) hypertension: Secondary | ICD-10-CM | POA: Diagnosis not present

## 2017-10-25 DIAGNOSIS — R351 Nocturia: Secondary | ICD-10-CM | POA: Diagnosis not present

## 2017-10-25 DIAGNOSIS — Z1159 Encounter for screening for other viral diseases: Secondary | ICD-10-CM | POA: Diagnosis not present

## 2017-10-25 DIAGNOSIS — N183 Chronic kidney disease, stage 3 unspecified: Secondary | ICD-10-CM

## 2017-10-25 DIAGNOSIS — Z1509 Genetic susceptibility to other malignant neoplasm: Secondary | ICD-10-CM

## 2017-10-25 DIAGNOSIS — Z1589 Genetic susceptibility to other disease: Secondary | ICD-10-CM

## 2017-10-25 DIAGNOSIS — E538 Deficiency of other specified B group vitamins: Secondary | ICD-10-CM | POA: Diagnosis not present

## 2017-10-25 LAB — COMPREHENSIVE METABOLIC PANEL
ALBUMIN: 4.4 g/dL (ref 3.5–5.2)
ALT: 14 U/L (ref 0–53)
AST: 18 U/L (ref 0–37)
Alkaline Phosphatase: 84 U/L (ref 39–117)
BILIRUBIN TOTAL: 1.3 mg/dL — AB (ref 0.2–1.2)
BUN: 16 mg/dL (ref 6–23)
CALCIUM: 9.6 mg/dL (ref 8.4–10.5)
CHLORIDE: 103 meq/L (ref 96–112)
CO2: 30 meq/L (ref 19–32)
CREATININE: 1.28 mg/dL (ref 0.40–1.50)
GFR: 58.94 mL/min — AB (ref >60.00)
Glucose, Bld: 92 mg/dL (ref 70–99)
Potassium: 4.1 mEq/L (ref 3.5–5.1)
Sodium: 141 mEq/L (ref 135–145)
Total Protein: 6.8 g/dL (ref 6.0–8.3)

## 2017-10-25 LAB — CBC
HEMATOCRIT: 45.2 % (ref 39.0–52.0)
Hemoglobin: 15.3 g/dL (ref 13.0–17.0)
MCHC: 33.8 g/dL (ref 30.0–36.0)
MCV: 87 fl (ref 78.0–100.0)
Platelets: 209 10*3/uL (ref 150.0–400.0)
RBC: 5.2 Mil/uL (ref 4.22–5.81)
RDW: 13.2 % (ref 11.5–15.5)
WBC: 7.1 10*3/uL (ref 4.0–10.5)

## 2017-10-25 LAB — LIPID PANEL
CHOL/HDL RATIO: 3
CHOLESTEROL: 143 mg/dL (ref 0–200)
HDL: 49.7 mg/dL (ref >39.00)
LDL CALC: 70 mg/dL (ref 0–99)
NonHDL: 93.44
TRIGLYCERIDES: 116 mg/dL (ref 0.0–149.0)
VLDL: 23.2 mg/dL (ref 0.0–40.0)

## 2017-10-25 LAB — POC URINALSYSI DIPSTICK (AUTOMATED)
BILIRUBIN UA: NEGATIVE
GLUCOSE UA: NEGATIVE
Ketones, UA: NEGATIVE
LEUKOCYTES UA: NEGATIVE
NITRITE UA: NEGATIVE
Protein, UA: NEGATIVE
Spec Grav, UA: 1.025 (ref 1.010–1.025)
UROBILINOGEN UA: 0.2 U/dL
pH, UA: 5.5 (ref 5.0–8.0)

## 2017-10-25 LAB — PSA: PSA: 1.8 ng/mL (ref 0.10–4.00)

## 2017-10-25 LAB — VITAMIN B12: Vitamin B-12: 643 pg/mL (ref 211–911)

## 2017-10-25 LAB — URINALYSIS, MICROSCOPIC ONLY

## 2017-10-25 MED ORDER — FA-PYRIDOXINE-CYANOCOBALAMIN 2.5-25-2 MG PO TABS: 1.0000 | ORAL_TABLET | Freq: Every day | ORAL | 1 refills | Status: DC

## 2017-10-25 NOTE — Assessment & Plan Note (Signed)
S: well controlled on atorvastatin 40 mg- also on aspirin for primary prevention. No myalgias.  Lab Results  Component Value Date   CHOL 130 10/15/2016   HDL 32.30 (L) 10/15/2016   LDLCALC 72 10/15/2016   LDLDIRECT 65.3 01/29/2010   TRIG 126.0 10/15/2016   CHOLHDL 4 10/15/2016   A/P: continue current medicine- update lipids

## 2017-10-25 NOTE — Addendum Note (Signed)
Addended by: Frutoso Chase A on: 10/25/2017 09:00 AM   Modules accepted: Orders

## 2017-10-25 NOTE — Addendum Note (Signed)
Addended by: Marin Olp on: 10/25/2017 12:40 PM   Modules accepted: Orders

## 2017-10-25 NOTE — Assessment & Plan Note (Signed)
S: controlled on lisinopril 20mg  A/P: We discussed blood pressure goal of <140/90. Continue current meds

## 2017-10-25 NOTE — Progress Notes (Signed)
Phone: [phone]  Subjective:  Patient presents today for their annual wellness visit.    Preventive Screening-Counseling & Management  Smoking Status: Never Smoker Second Hand Smoking status: No smokers in home  Risk Factors Regular exercise: yes at least 2-3 days a week and better in summer- at least 30 minutes biking or walking Diet: reasonable  Fall Risk: None  Fall Risk  10/25/2017 10/22/2016 10/21/2015 10/16/2014  Falls in the past year? No No No No  Opioid use history:  no long term opioids use. Used related to surgery years ago- only used 3-4 tablets  Cardiac risk factors:  advanced age (older than 54 for men, 25 for women) yes Hyperlipidemia - yes but treated No diabetes.  Hypertension- but controlled Family History: yes in father but father was a lifetime smoker   Depression Screen None. PHQ2 0  Depression screen [facility] 2/9 10/25/2017 10/22/2016 10/21/2015 10/16/2014  Decreased Interest 0 0 0 0  Down, Depressed, Hopeless 0 0 0 0  PHQ - 2 Score 0 0 0 0    Activities of Daily Living Independent ADLs and IADLs   Hearing Difficulties: -patient endorses- following with audiology  Cognitive Testing No reported trouble.   Normal 3 word recall  List the Names of Other Physician/Practitioners you currently use: -Dr. Melina Copa [facility]. States never had polyp- on 10 year plan -Dr. Lonzo Candy audiology - [facility]- thinking of returning to [employer] History  Administered Date(s) Administered  . H1N1 09/23/2008  . Hep A / Hep B 09/23/2008, 01/17/2009, 04/11/2009  . Hepatitis A 09/23/2008  . Hepatitis B 09/23/2008  . Influenza Whole 07/24/2009, 07/21/2010  . Influenza, High Dose Seasonal PF 07/11/2017  . Influenza-Unspecified 07/04/2014, 07/06/2015, 07/07/2016  . Pneumococcal Conjugate-13 08/14/2013  . Pneumococcal Polysaccharide-23 06/19/2012  . Td 09/20/2001  . Tdap 10/16/2014   Required Immunizations - discussed shingrix needed  today   Screening tests- up to date other than needing colonoscopy (discussed below-opts cologuard) and HCV screening (ordered today)  ROS- No pertinent positives discovered in course of AWV other than known hearing loss- follows with audiology Ros pertinent to HTN and HLD- No chest pain or shortness of breath. No headache or blurry vision.   The following were reviewed and entered/updated in epic: Past Medical History:  Diagnosis Date  . B12 deficiency anemia   . Diverticulosis   . Family history of breast cancer   . Hearing loss in left ear    essentially nonfunctioning cochlear implant  . Hearing loss of right ear   . Hyperlipidemia   . Hypertension    Patient Active Problem List   Diagnosis Date Noted  . Monoallelic mutation of PALB2 gene 09/26/2015    Priority: High  . CKD (chronic kidney disease), stage III ([place]) 11/16/2011    Priority: Medium  . Essential hypertension 01/25/2008    Priority: Medium  . Hyperlipidemia 05/03/2007    Priority: Medium  . Family history of breast cancer     Priority: Low  . Allergic rhinitis 10/16/2014    Priority: Low  . NEPHROLITHIASIS, RECURRENT 04/30/2010    Priority: Low  . Vitamin B12 deficiency neuropathy ([place]) 11/01/2008    Priority: Low   Past Surgical History:  Procedure Laterality Date  . COCHLEAR IMPLANT     left  . COLONOSCOPY    . KIDNEY STONE SURGERY    . nasal polyp removal bilateral    . skin cancer removal     2010-dermatology (sees derm regularly)  Family History  Problem Relation Age of Onset  . Breast cancer Mother        dx in her 70s  . Diabetes Mother   . Hypertension Father   . Heart disease Father        lieftime smoker  . COPD Father   . Cancer Maternal Aunt        1 maternal aunt with cancer NOS in her late 46s  . Cancer Maternal Uncle        2 maternal uncles with cancer NOS  . Cancer Maternal Grandfather        cancer NOS  . Hodgkin's lymphoma Cousin     Medications- reviewed and  updated Current Outpatient Medications  Medication Sig Dispense Refill  . aspirin 81 MG tablet Take 81 mg by mouth daily.      Marland Kitchen atorvastatin (LIPITOR) 40 MG tablet Take 1 tablet (40 mg total) by mouth daily. 90 tablet 3  . cetirizine (ZYRTEC) 10 MG tablet Take 10 mg by mouth daily.      . Cholecalciferol (VITAMIN D3) 2000 UNITS TABS Take 2,000 Units by mouth daily.      . folic acid-pyridoxine-cyancobalamin (FOLBIC) 2.5-25-2 MG TABS tablet Take 1 tablet by mouth daily. 90 tablet 1  . lisinopril (PRINIVIL,ZESTRIL) 20 MG tablet Take 1 tablet (20 mg total) by mouth daily. 90 tablet 3  . niacin 500 MG CR capsule Take 500 mg by mouth at bedtime.       No current facility-administered medications for this visit.     Allergies-reviewed and updated No Known Allergies  Social History   Socioeconomic History  . Marital status: Married    Spouse name: None  . Number of children: 3  . Years of education: None  . Highest education level: None  Social Needs  . Financial resource strain: None  . Food insecurity - worry: None  . Food insecurity - inability: None  . Transportation needs - medical: None  . Transportation needs - non-medical: None  Occupational History  . None  Tobacco Use  . Smoking status: Never Smoker  . Smokeless tobacco: Never Used  Substance and Sexual Activity  . Alcohol use: Yes    Alcohol/week: 0.0 oz    Comment: 1 a month  . Drug use: No  . Sexual activity: Yes  Other Topics Concern  . None  Social History Narrative   Married (wife Baker Janus patient of dr. Yong Channel). 3 children -2 twin girls and son, 34 grandchildren      Working at [employer].       Hobbies: formerly played tennis and runs now mostly grandkids time and yardwork    Objective: There were no vitals taken for this visit. Gen: NAD, resting comfortably HEENT: Mucous membranes are moist. Oropharynx normal Neck: no thyromegaly CV: RRR no murmurs rubs or gallops Lungs: CTAB no crackles, wheeze,  rhonchi Abdomen: soft/nontender/nondistended/normal bowel sounds. No rebound or guarding.  Ext: no edema Skin: warm, dry Neuro: grossly normal, moves all extremities, PERRLA  Assessment/Plan:  AWV completed- discussed recommended screenings anddocumented any personalized health advice and referrals for preventive counseling. See AVS as well which was given to patient.   Status of chronic or acute concerns   Other issues 1. Doing well with B complex vitamin- on folbic. Prior had neuropathy- resolved largely while on b complex- rare tingling into both legs now but much improved from baseline 2. Continues claritin for allergies- just as needed 3. PALB2 mutation- higher risk for prostate  and breast cancer- we are screening for prostate cancer. He has felt no breast lumps. PSA not covered under screening 4. Prostate cancer screening- low risk trend and rectal exam (does have BPH this year and does pee once a night).   Plan to screen through age 60 at least due to palb2 carrier.  Component Value Date   PSA 1.65 10/15/2016   PSA 1.43 10/14/2015   PSA 1.63 09/28/2013     5. Colon cancer screening - last in 2006, discussed repeat  last year 2018 and year prior 2017. Plan was for him to call Dr. Melina Copa in [place]- he did this but Dr. Melina Copa ended up being sick. States never had polyps- will use cologuard instead due to difficulty in getting him scheduled 6. Skin cancer screening- follows yearly with skin surgery center- usually around march   Essential hypertension S: controlled on lisinopril 29m A/P: We discussed blood pressure goal of <140/90. Continue current meds  CKD (chronic kidney disease), stage III S: GFR has been stable around 60. Bp controlled, lipids controlled. Avoids nsaids A/P: discussed continued risk factor modification  Hyperlipidemia S: well controlled on atorvastatin 40 mg- also on aspirin for primary prevention. No myalgias.  Lab Results  Component Value Date    CHOL 130 10/15/2016   HDL 32.30 (L) 10/15/2016   LDLCALC 72 10/15/2016   LDLDIRECT 65.3 01/29/2010   TRIG 126.0 10/15/2016   CHOLHDL 4 10/15/2016   A/P: continue current medicine- update lipids  6 months advised  Lab/Order associations: Essential hypertension - Plan: CBC, Comprehensive metabolic panel, Lipid panel, POCT Urinalysis Dipstick (Automated)  Hyperlipidemia, unspecified hyperlipidemia type - Plan: CBC, Comprehensive metabolic panel, Lipid panel, POCT Urinalysis Dipstick (Automated)  Vitamin B12 deficiency neuropathy (HCC) - Plan: Vitamin B12  Encounter for HCV screening test for low risk patient - Plan: Hepatitis C antibody  BPH associated with nocturia - Plan: PSA  Return precautions advised. SGarret Reddish MD

## 2017-10-25 NOTE — Patient Instructions (Addendum)
Please stop by lab before you go  Can get shingrix at the pharmacy if you can find it Systems developer)  Id like to check in 6 months from now to make sure we get that cologuard done.   In 1 year- can schedule a visit with me as well as an annual wellness visit with our wellness nurse- either Rubin Payor or Manuela Schwartz    Bryan Carson , Thank you for taking time to come for your Medicare Wellness Visit. I appreciate your ongoing commitment to your health goals. Please review the following plan we discussed and let me know if I can assist you in the future.   These are the goals we discussed: 1. Exercise 150 minutes a week 2. Eat 5 servings of fruits/veggies per day 3. See [place] at least yearly   This is a list of the screening recommended for you and due dates:  Health Maintenance  Topic Date Due  .  Hepatitis C: One time screening is recommended by Center for Disease Control  (CDC) for  adults born from [birth year] through 1964.   1946/01/22  . Colon Cancer Screening  11/04/2014  . Tetanus Vaccine  10/16/2024  . Flu Shot  Completed  . Pneumonia vaccines  Completed

## 2017-10-25 NOTE — Assessment & Plan Note (Signed)
S: GFR has been stable around 60. Bp controlled, lipids controlled. Avoids nsaids A/P: discussed continued risk factor modification

## 2017-10-26 LAB — HEPATITIS C ANTIBODY
Hepatitis C Ab: NONREACTIVE
SIGNAL TO CUT-OFF: 0.01 (ref <1.00)

## 2017-11-08 DIAGNOSIS — Z1212 Encounter for screening for malignant neoplasm of rectum: Secondary | ICD-10-CM | POA: Diagnosis not present

## 2017-11-08 DIAGNOSIS — Z1211 Encounter for screening for malignant neoplasm of colon: Secondary | ICD-10-CM | POA: Diagnosis not present

## 2017-11-15 LAB — COLOGUARD: Cologuard: NEGATIVE

## 2018-01-21 ENCOUNTER — Encounter: Payer: Self-pay | Admitting: Family Medicine

## 2018-04-17 ENCOUNTER — Other Ambulatory Visit: Payer: Self-pay | Admitting: Family Medicine

## 2018-04-26 ENCOUNTER — Other Ambulatory Visit: Payer: Self-pay | Admitting: Family Medicine

## 2018-05-23 ENCOUNTER — Other Ambulatory Visit: Payer: Self-pay

## 2018-05-23 MED ORDER — LISINOPRIL 20 MG PO TABS: 20.0000 mg | ORAL_TABLET | Freq: Every day | ORAL | 3 refills | Status: DC

## 2018-07-18 IMAGING — CT CT MAXILLOFACIAL W/O CM
2 series · 15 of 30 positions shown, 18 images · non-contrast
Comparison: Brain MRI 01/04/2012 and earlier.

CLINICAL DATA: 70-year-old male with sinus drainage. Nasal polyps.
Preoperative surgical planning. Initial encounter.

EXAM:
CT MAXILLOFACIAL WITHOUT CONTRAST
TECHNIQUE: Multidetector CT imaging of the maxillofacial structures was
performed. Multiplanar CT image reconstructions were also generated.
A small metallic BB was placed on the right temple in order to
reliably differentiate right from left.

[Series 3: axial soft 1.25 · axial · 0.49mm/px · z∈[-52,+45]mm · 5 of 165 slices shown]
[im 20/165  brain]
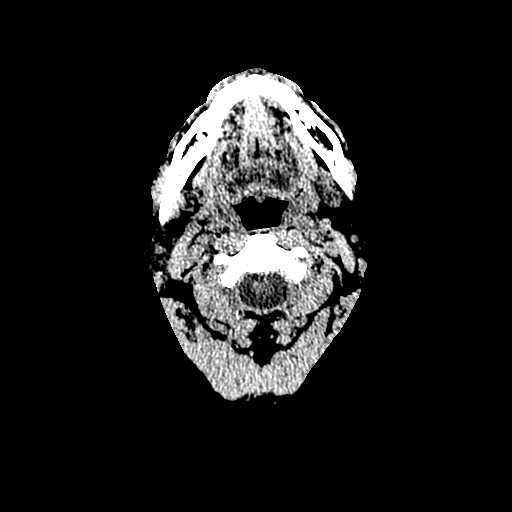
[im 39/165  brain]
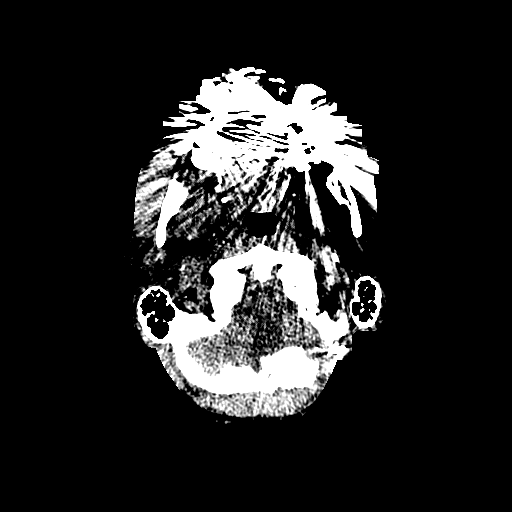
[im 58/165  brain]
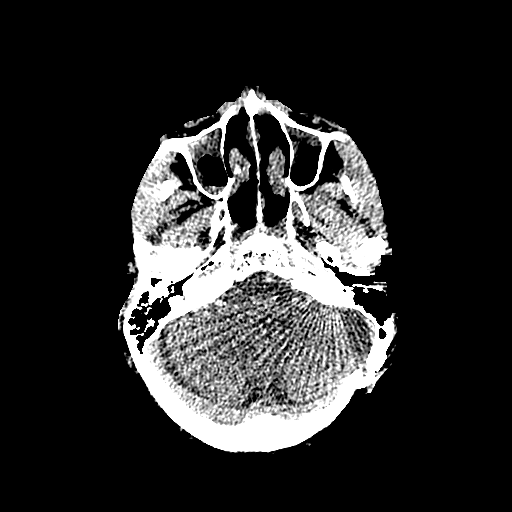
[im 78/165  brain]
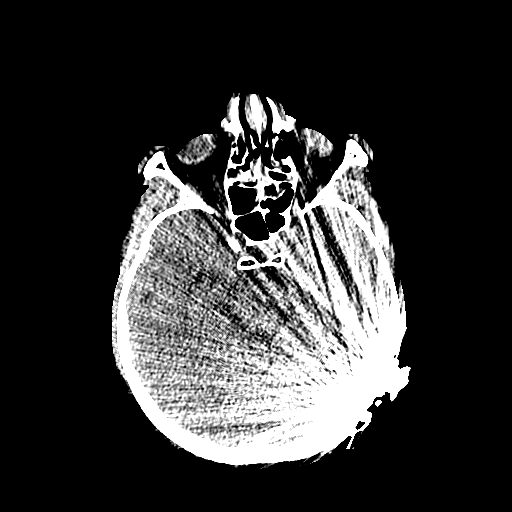
[im 97/165  brain]
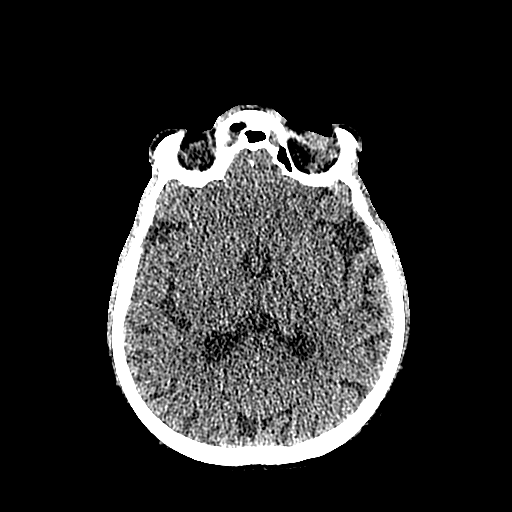

[Series 602: sagittal facial · sagittal · 0.49mm/px · 10 of 106 slices shown, 13 images]
[im 10/106  brain]
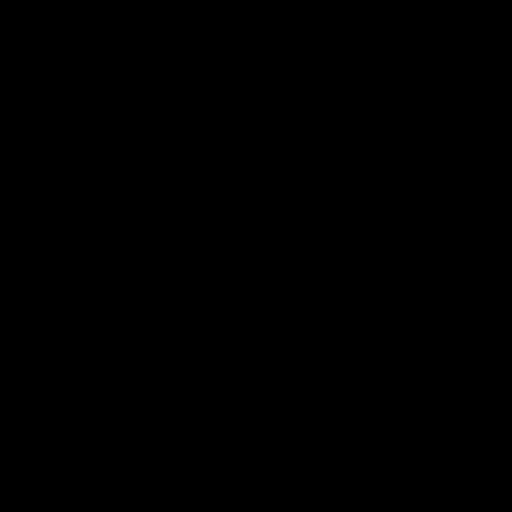
[im 10/106  bone]
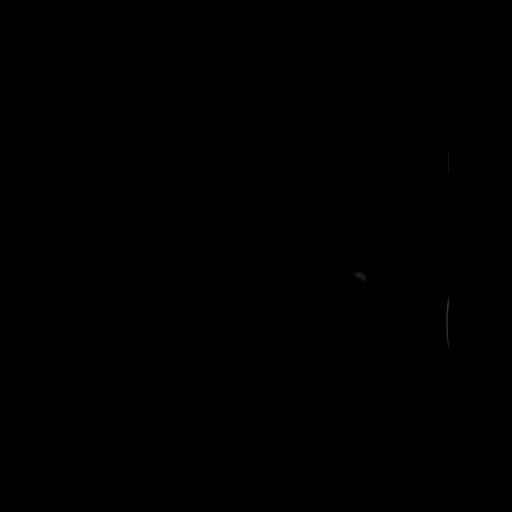
[im 20/106  bone]
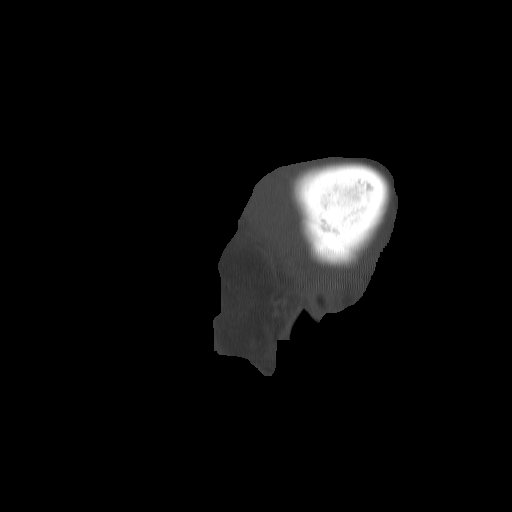
[im 29/106  bone]
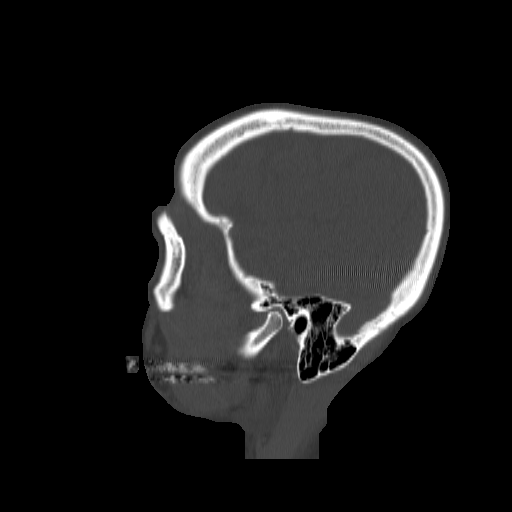
[im 39/106  bone]
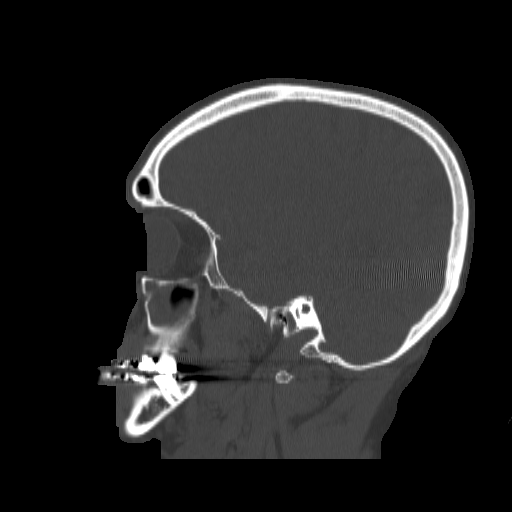
[im 48/106  brain]
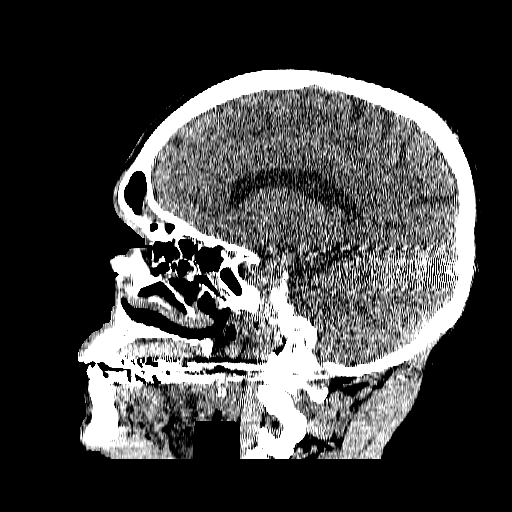
[im 48/106  bone]
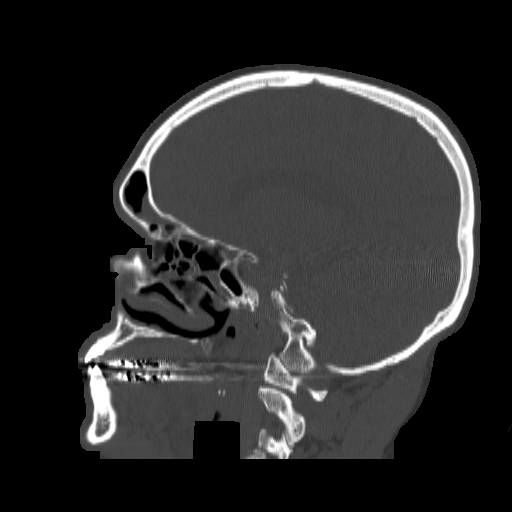
[im 58/106  bone]
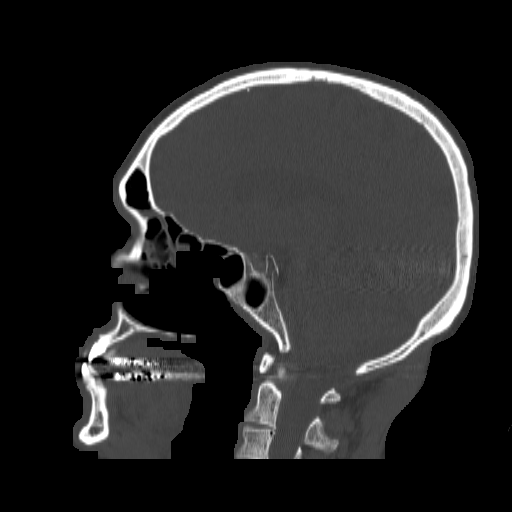
[im 67/106  bone]
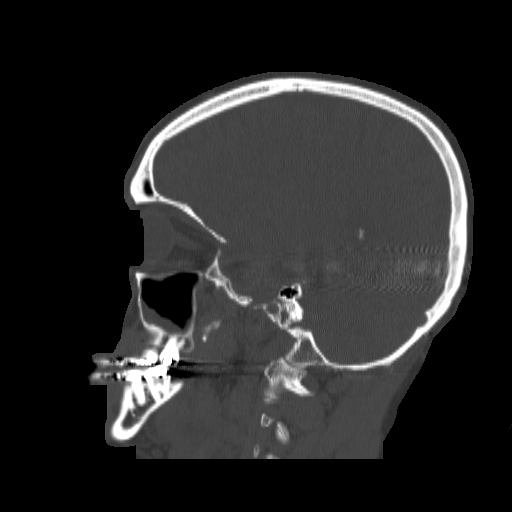
[im 77/106  bone]
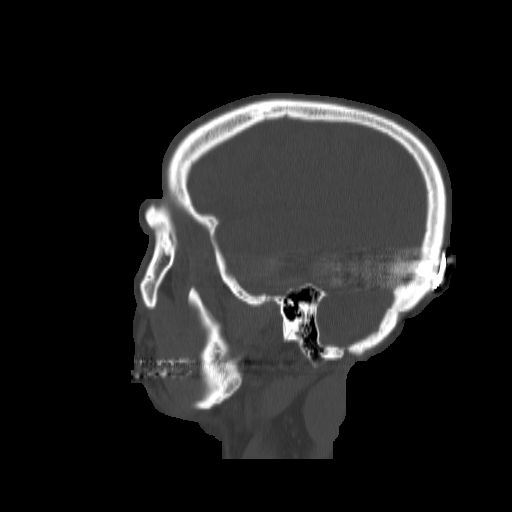
[im 86/106  brain]
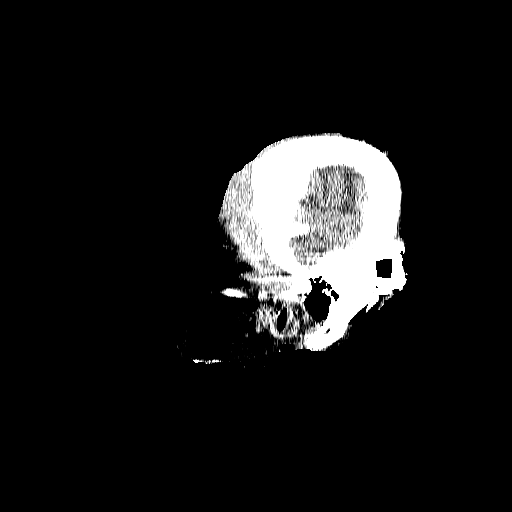
[im 86/106  bone]
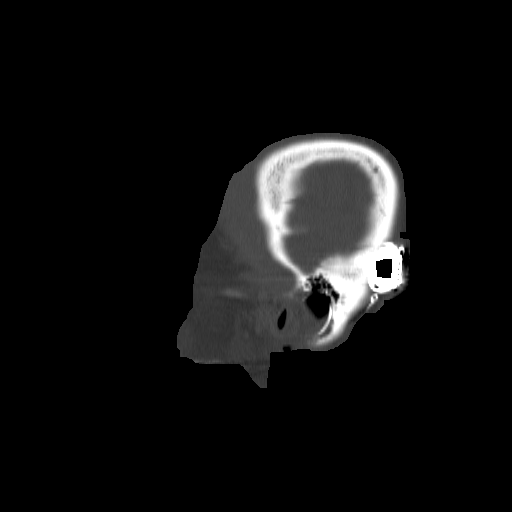
[im 96/106  bone]
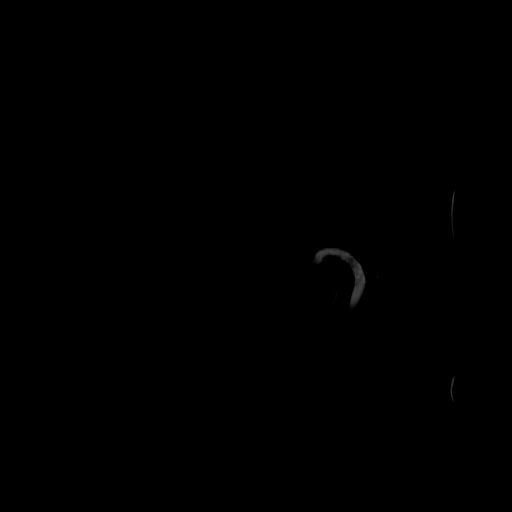

[15 of 30 positions shown; findings below may reference images not displayed]

FINDINGS: Osseous: Sequelae of left canal up mastoidectomy and left cochlear
implant placement. No acute osseous abnormality identified. Tympanic
cavities and residual bilateral mastoid air cells are clear.

Orbits: Postoperative changes to both globes, otherwise negative.

Sinuses: Chronic generalize paranasal sinus mucosal thickening,
progression since 7226.

Mild to moderate mucoperiosteal thickening in both sphenoid sinuses,
greater on the right. Both sphenoid ostia are involved.

Moderate scattered bilateral ethmoid sinus mucosal thickening.

Mild to moderate inferior frontal sinus mucosal thickening greater
on the right.

Mild to moderate left and moderate to severe mucosal thickening in
the maxillary sinuses. Trace bubbly opacity. Both OMCs are opacified
(coronal image 37).

Leftward nasal septal deviation. Symmetric nasal cavity mucosal
thickening. Tiny polypoid areas along the inferior turbinates with
no discrete nasal cavity mass.

Soft tissues: Negative visualized noncontrast pharynx,
parapharyngeal spaces, retropharyngeal space, sublingual space,
submandibular glands and parotid glands. Visualized scalp soft
tissues are within normal limits.

Limited intracranial: Streak artifact related to left cochlear
implant. Negative visualized noncontrast brain parenchyma. Calcified
atherosclerosis at the skull base.
IMPRESSION: 1. Chronic pansinus inflammation which has progressed in general
since 7226. Mild sphenoid periostitis. Trace maxillary bubbly
opacity suggesting a component of acute sinusitis.
2. Symmetric nasal cavity mucosal thickening suggesting rhinitis.
Leftward nasal septal deviation. Possible tiny polyps along the
inferior turbinates.
3. Sequelae of left canal up mastoidectomy and left cochlear implant
placement with no adverse features.

## 2018-07-19 DIAGNOSIS — L814 Other melanin hyperpigmentation: Secondary | ICD-10-CM | POA: Diagnosis not present

## 2018-07-19 DIAGNOSIS — L821 Other seborrheic keratosis: Secondary | ICD-10-CM | POA: Diagnosis not present

## 2018-07-19 DIAGNOSIS — L728 Other follicular cysts of the skin and subcutaneous tissue: Secondary | ICD-10-CM | POA: Diagnosis not present

## 2018-07-19 DIAGNOSIS — L819 Disorder of pigmentation, unspecified: Secondary | ICD-10-CM | POA: Diagnosis not present

## 2018-07-19 DIAGNOSIS — D229 Melanocytic nevi, unspecified: Secondary | ICD-10-CM | POA: Diagnosis not present

## 2018-07-19 DIAGNOSIS — D1801 Hemangioma of skin and subcutaneous tissue: Secondary | ICD-10-CM | POA: Diagnosis not present

## 2018-07-19 DIAGNOSIS — Z85828 Personal history of other malignant neoplasm of skin: Secondary | ICD-10-CM | POA: Diagnosis not present

## 2018-07-27 ENCOUNTER — Encounter: Payer: Self-pay | Admitting: Family Medicine

## 2018-07-27 ENCOUNTER — Ambulatory Visit (INDEPENDENT_AMBULATORY_CARE_PROVIDER_SITE_OTHER): Payer: Medicare Other

## 2018-07-27 DIAGNOSIS — Z23 Encounter for immunization: Secondary | ICD-10-CM | POA: Diagnosis not present

## 2018-10-26 ENCOUNTER — Encounter: Payer: Self-pay | Admitting: Family Medicine

## 2018-10-26 ENCOUNTER — Ambulatory Visit: Payer: Medicare Other

## 2018-10-26 ENCOUNTER — Ambulatory Visit (INDEPENDENT_AMBULATORY_CARE_PROVIDER_SITE_OTHER): Payer: Medicare Other | Admitting: Family Medicine

## 2018-10-26 VITALS — BP 120/60 | HR 71 | Temp 98.1°F | Ht 67.0 in | Wt 147.0 lb

## 2018-10-26 DIAGNOSIS — Z0000 Encounter for general adult medical examination without abnormal findings: Secondary | ICD-10-CM | POA: Diagnosis not present

## 2018-10-26 DIAGNOSIS — N183 Chronic kidney disease, stage 3 unspecified: Secondary | ICD-10-CM

## 2018-10-26 DIAGNOSIS — E538 Deficiency of other specified B group vitamins: Secondary | ICD-10-CM

## 2018-10-26 DIAGNOSIS — E785 Hyperlipidemia, unspecified: Secondary | ICD-10-CM

## 2018-10-26 DIAGNOSIS — I10 Essential (primary) hypertension: Secondary | ICD-10-CM | POA: Diagnosis not present

## 2018-10-26 DIAGNOSIS — N401 Enlarged prostate with lower urinary tract symptoms: Secondary | ICD-10-CM | POA: Diagnosis not present

## 2018-10-26 DIAGNOSIS — G63 Polyneuropathy in diseases classified elsewhere: Secondary | ICD-10-CM

## 2018-10-26 DIAGNOSIS — R351 Nocturia: Secondary | ICD-10-CM | POA: Diagnosis not present

## 2018-10-26 LAB — LIPID PANEL
Cholesterol: 153 mg/dL (ref 0–200)
HDL: 40.4 mg/dL (ref >39.00)
LDL Cholesterol: 82 mg/dL (ref 0–99)
NONHDL: 112.64
Total CHOL/HDL Ratio: 4
Triglycerides: 152 mg/dL — ABNORMAL HIGH (ref 0.0–149.0)
VLDL: 30.4 mg/dL (ref 0.0–40.0)

## 2018-10-26 LAB — CBC
HEMATOCRIT: 47.6 % (ref 39.0–52.0)
HEMOGLOBIN: 15.9 g/dL (ref 13.0–17.0)
MCHC: 33.5 g/dL (ref 30.0–36.0)
MCV: 88 fl (ref 78.0–100.0)
Platelets: 236 10*3/uL (ref 150.0–400.0)
RBC: 5.41 Mil/uL (ref 4.22–5.81)
RDW: 13.5 % (ref 11.5–15.5)
WBC: 8 10*3/uL (ref 4.0–10.5)

## 2018-10-26 LAB — COMPREHENSIVE METABOLIC PANEL
ALT: 22 U/L (ref 0–53)
AST: 20 U/L (ref 0–37)
Albumin: 4.6 g/dL (ref 3.5–5.2)
Alkaline Phosphatase: 101 U/L (ref 39–117)
BILIRUBIN TOTAL: 1 mg/dL (ref 0.2–1.2)
BUN: 17 mg/dL (ref 6–23)
CO2: 30 mEq/L (ref 19–32)
Calcium: 10.1 mg/dL (ref 8.4–10.5)
Chloride: 106 mEq/L (ref 96–112)
Creatinine, Ser: 1.34 mg/dL (ref 0.40–1.50)
GFR: 52.45 mL/min — AB (ref >60.00)
GLUCOSE: 99 mg/dL (ref 70–99)
Potassium: 4.9 mEq/L (ref 3.5–5.1)
Sodium: 142 mEq/L (ref 135–145)
Total Protein: 6.9 g/dL (ref 6.0–8.3)

## 2018-10-26 LAB — PSA: PSA: 2.39 ng/mL (ref 0.10–4.00)

## 2018-10-26 LAB — VITAMIN B12

## 2018-10-26 MED ORDER — LISINOPRIL 20 MG PO TABS: 20.0000 mg | ORAL_TABLET | Freq: Every day | ORAL | 3 refills | Status: DC

## 2018-10-26 MED ORDER — ATORVASTATIN CALCIUM 40 MG PO TABS: 40.0000 mg | ORAL_TABLET | Freq: Every day | ORAL | 3 refills | Status: DC

## 2018-10-26 MED ORDER — FA-PYRIDOXINE-CYANOCOBALAMIN 2.5-25-2 MG PO TABS: 1.0000 | ORAL_TABLET | Freq: Every day | ORAL | 3 refills | Status: DC

## 2018-10-26 NOTE — Patient Instructions (Addendum)
Health Maintenance Due  Topic Date Due  . COLONOSCOPY  Patient stated he done the cologaurd will look in chart 11/04/2014   . Please check with your pharmacy to see if they have the shingrix vaccine. If they do- please get this immunization and update [place] by phone call or mychart with dates you receive the vaccine       Please stop by lab before you go If you do not have mychart- we will call you about results within 5 business days of [place] receiving them.  If you have mychart- we will send your results within 3 business days of [place] receiving them.  If abnormal or we want to clarify a result, we will call or mychart you to make sure you receive the message.  If you have questions or concerns or don't hear within 5-7 days, please send [place] a message or call us.

## 2018-10-26 NOTE — Progress Notes (Signed)
Phone: [phone]   Subjective:  Patient presents today for their annual physical. Chief complaint-noted.   See problem oriented charting- ROS- full  review of systems was completed and negative except for: hearing loss .  The following were reviewed and entered/updated in epic: Past Medical History:  Diagnosis Date  . B12 deficiency anemia   . Diverticulosis   . Family history of breast cancer   . Hearing loss in left ear    essentially nonfunctioning cochlear implant  . Hearing loss of right ear   . Hyperlipidemia   . Hypertension    Patient Active Problem List   Diagnosis Date Noted  . Monoallelic mutation of PALB2 gene 09/26/2015    Priority: High  . Cochlear implant in place 12/21/2013    Priority: High  . CKD (chronic kidney disease), stage III ([place]) 11/16/2011    Priority: Medium  . Essential hypertension 01/25/2008    Priority: Medium  . Hyperlipidemia 05/03/2007    Priority: Medium  . Family history of breast cancer     Priority: Low  . Allergic rhinitis 10/16/2014    Priority: Low  . History of skin cancer- follows with skin surgery center yearly  08/23/2013    Priority: Low  . Trigeminal neuralgia 01/19/2012    Priority: Low  . NEPHROLITHIASIS, RECURRENT 04/30/2010    Priority: Low  . Vitamin B12 deficiency neuropathy ([place]) 11/01/2008    Priority: Low   Past Surgical History:  Procedure Laterality Date  . COCHLEAR IMPLANT     left  . COLONOSCOPY    . KIDNEY STONE SURGERY    . nasal polyp removal bilateral    . skin cancer removal     2010-dermatology (sees derm regularly)    Family History  Problem Relation Age of Onset  . Breast cancer Mother        dx in her 59s  . Diabetes Mother   . Hypertension Father   . Heart disease Father        lieftime smoker  . COPD Father   . Cancer Maternal Aunt        1 maternal aunt with cancer NOS in her late 77s  . Cancer Maternal Uncle        2 maternal uncles with cancer NOS  . Cancer Maternal  Grandfather        cancer NOS  . Hodgkin's lymphoma Cousin     Medications- reviewed and updated Current Outpatient Medications  Medication Sig Dispense Refill  . aspirin 81 MG tablet Take 81 mg by mouth daily.      Marland Kitchen atorvastatin (LIPITOR) 40 MG tablet Take 1 tablet (40 mg total) by mouth daily. 90 tablet 3  . Cholecalciferol (VITAMIN D3) 2000 UNITS TABS Take 2,000 Units by mouth daily.      . folic acid-pyridoxine-cyancobalamin (FOLBIC) 2.5-25-2 MG TABS tablet Take 1 tablet by mouth daily. 90 tablet 3  . lisinopril (PRINIVIL,ZESTRIL) 20 MG tablet Take 1 tablet (20 mg total) by mouth daily. 90 tablet 3  . niacin 500 MG CR capsule Take 500 mg by mouth at bedtime.       No current facility-administered medications for this visit.     Allergies-reviewed and updated No Known Allergies  Social History   Social History Narrative   Married (wife Baker Janus patient of dr. Yong Channel). 3 children -2 twin girls and son, 76 grandchildren      Fully retired- worked for [employer] until age 86      Hobbies: formerly  played tennis and runs now mostly grandkids time and yardwork   Objective  Objective:  BP 120/60 (BP Location: Left Arm, Patient Position: Sitting, Cuff Size: Normal)   Pulse 71   Temp 98.1 F (36.7 C) (Oral)   Ht 5' 7"  (1.702 m)   Wt 147 lb (66.7 kg)   SpO2 98%   BMI 23.02 kg/m  Gen: NAD, resting comfortably HEENT: Mucous membranes are moist. Oropharynx normal Neck: no thyromegaly CV: RRR no murmurs rubs or gallops Lungs: CTAB no crackles, wheeze, rhonchi Abdomen: soft/nontender/nondistended/normal bowel sounds. No rebound or guarding.  Ext: no edema Skin: warm, dry Neuro: grossly normal, moves all extremities, PERRLA   Assessment and Plan  73 y.o. male presenting for annual physical.  Health Maintenance counseling: 1. Anticipatory guidance: Patient counseled regarding regular dental exams -q6 months advised- his dentist retired so he needs to get a new dentist, eye exams  -yearly or every other year,  avoiding smoking and second hand smoke , limiting alcohol to 2 beverages per day - 2 a month.   2. Risk factor reduction:  Advised patient of need for regular exercise and diet rich and fruits and vegetables to reduce risk of heart attack and stroke. Exercise- not as good in winter- plans to pick this up in spring- plans to ride bike when weather better. Diet-reasonably balanced diet- doesn't eat out a lot.  Wt Readings from Last 3 Encounters:  10/26/18 147 lb (66.7 kg)  10/25/17 150 lb 3.2 oz (68.1 kg)  10/22/16 152 lb 12.8 oz (69.3 kg)  3. Immunizations/screenings/ancillary studies-discussed Shingrix at the pharmacy  Immunization History  Administered Date(s) Administered  . H1N1 09/23/2008  . Hep A / Hep B 09/23/2008, 01/17/2009, 04/11/2009  . Hepatitis A 09/23/2008  . Hepatitis B 09/23/2008  . Influenza Whole 07/24/2009, 07/21/2010, 07/02/2012  . Influenza, High Dose Seasonal PF 07/11/2017, 07/27/2018  . Influenza-Unspecified 07/04/2014, 07/06/2015, 07/07/2016  . Pneumococcal Conjugate-13 08/14/2013  . Pneumococcal Polysaccharide-23 06/19/2012  . Td 09/20/2001  . Tdap 10/16/2014  4. Prostate cancer screening- high risk for prostate cancer given PALB 2 carrier-we will screen at least through age 58 with PSA alone as long as PSA trend is not concerning Lab Results  Component Value Date   PSA 1.80 10/25/2017   PSA 1.65 10/15/2016   PSA 1.43 10/14/2015   5. Colon cancer screening - patient did not follow through with colonoscopy in 2017 or 2018- opted for Cologuard in 2019-I do not have results back from this- we are going to try to get the results as he states had this done.  BPH with nocturia has been noted 6. Skin cancer screening-follows with skin surgery center.  Advised regular sunscreen use. Denies worrisome, changing, or new skin lesions.  7.  Never smoker 8. Also does self exams due to palb2 mutation and higher risk of breast cancer- no breast  lumps  Status of chronic or acute concerns   #Hypertension S: Compliant with lisinopril 20 mg A/P:  Stable. Continue current medications.     #CKD stage III S: GFR has been stable around 60.  Blood pressure has been controlled, lipids controlled.  Knows to avoid NSAIDs. A/P:  Stable. Continue current medications.  Update BMP/CMP   #Hyperlipidemia S: Compliant with atorvastatin 40 mg.  He also prefers to continue aspirin for primary prevention. A/P:  Stable. Continue current medications.  Update lipids. He is going to talk to his wife about stopping aspirin- he is unsure at moment  #Vitamin B12 deficiency/neuropathy  S: Compliant with folbic.  Neuropathy largely resolved on B complex-rare tingling into both legs in past- no recent issues A/P:  Stable. Continue current medications.  Update B12  Future Appointments  Date Time Provider [place]  10/31/2019  9:40 AM Marin Olp, MD [facility]   Lab/Order associations:fasting  Preventative health care  CKD (chronic kidney disease), stage III ([facility])  Vitamin B12 deficiency neuropathy ([place]) - Plan: Vitamin B12  Essential hypertension - Plan: CBC, Lipid panel, Comprehensive metabolic panel  Hyperlipidemia, unspecified hyperlipidemia type - Plan: CBC, Lipid panel, Comprehensive metabolic panel  BPH associated with nocturia - Plan: PSA  Meds ordered this encounter  Medications  . atorvastatin (LIPITOR) 40 MG tablet    Sig: Take 1 tablet (40 mg total) by mouth daily.    Dispense:  90 tablet    Refill:  3  . folic acid-pyridoxine-cyancobalamin (FOLBIC) 2.5-25-2 MG TABS tablet    Sig: Take 1 tablet by mouth daily.    Dispense:  90 tablet    Refill:  3  . lisinopril (PRINIVIL,ZESTRIL) 20 MG tablet    Sig: Take 1 tablet (20 mg total) by mouth daily.    Dispense:  90 tablet    Refill:  3   Return precautions advised.  Garret Reddish, MD

## 2018-10-31 NOTE — Addendum Note (Signed)
Addended by: Gwenyth Ober R on: 10/31/2018 10:24 AM   Modules accepted: Orders

## 2018-11-02 ENCOUNTER — Telehealth: Payer: Self-pay

## 2018-11-02 NOTE — Telephone Encounter (Signed)
Prior Authorization submitted for folic acid-pyridoxine-cyancobalamin (FOLBIC) 2.5-25-2 MG TABS tablet. Faxed to [facility]. Pending approval

## 2019-01-19 ENCOUNTER — Encounter: Payer: Self-pay | Admitting: Family Medicine

## 2019-01-30 ENCOUNTER — Other Ambulatory Visit: Payer: Medicare Other

## 2019-02-26 ENCOUNTER — Other Ambulatory Visit: Payer: Self-pay

## 2019-02-26 ENCOUNTER — Other Ambulatory Visit (INDEPENDENT_AMBULATORY_CARE_PROVIDER_SITE_OTHER): Payer: Medicare Other

## 2019-02-26 DIAGNOSIS — R351 Nocturia: Secondary | ICD-10-CM | POA: Diagnosis not present

## 2019-02-26 DIAGNOSIS — N401 Enlarged prostate with lower urinary tract symptoms: Secondary | ICD-10-CM

## 2019-02-26 LAB — PSA: PSA: 1.85 ng/mL (ref 0.10–4.00)

## 2019-06-26 ENCOUNTER — Encounter: Payer: Self-pay | Admitting: Family Medicine

## 2019-06-26 ENCOUNTER — Other Ambulatory Visit: Payer: Self-pay

## 2019-06-26 ENCOUNTER — Ambulatory Visit: Payer: Medicare Other

## 2019-07-17 DIAGNOSIS — H43812 Vitreous degeneration, left eye: Secondary | ICD-10-CM | POA: Diagnosis not present

## 2019-07-17 DIAGNOSIS — H04123 Dry eye syndrome of bilateral lacrimal glands: Secondary | ICD-10-CM | POA: Diagnosis not present

## 2019-07-23 DIAGNOSIS — L821 Other seborrheic keratosis: Secondary | ICD-10-CM | POA: Diagnosis not present

## 2019-07-23 DIAGNOSIS — D229 Melanocytic nevi, unspecified: Secondary | ICD-10-CM | POA: Diagnosis not present

## 2019-07-23 DIAGNOSIS — D1801 Hemangioma of skin and subcutaneous tissue: Secondary | ICD-10-CM | POA: Diagnosis not present

## 2019-07-23 DIAGNOSIS — L819 Disorder of pigmentation, unspecified: Secondary | ICD-10-CM | POA: Diagnosis not present

## 2019-10-29 ENCOUNTER — Other Ambulatory Visit: Payer: Self-pay | Admitting: Family Medicine

## 2019-10-30 NOTE — Progress Notes (Signed)
Phone: [phone]    Subjective:  Patient presents today for their annual physical. Chief complaint-noted.   See problem oriented charting- Review of Systems  Constitutional: Negative.   HENT: Positive for hearing loss.        Has hearing aid   Eyes: Negative.   Respiratory: Negative.   Cardiovascular: Negative.   Gastrointestinal: Negative.   Genitourinary: Negative.   Musculoskeletal: Positive for neck pain.       Some stiffness   Skin: Negative.   Neurological: Negative.   Endo/Heme/Allergies: Negative.   Psychiatric/Behavioral: Negative.    The following were reviewed and entered/updated in epic: Past Medical History:  Diagnosis Date  . B12 deficiency anemia   . Diverticulosis   . Family history of breast cancer   . Hearing loss in left ear    essentially nonfunctioning cochlear implant  . Hearing loss of right ear   . Hyperlipidemia   . Hypertension    Patient Active Problem List   Diagnosis Date Noted  . Monoallelic mutation of PALB2 gene 09/26/2015    Priority: High  . Cochlear implant in place 12/21/2013    Priority: High  . CKD (chronic kidney disease), stage III 11/16/2011    Priority: Medium  . Essential hypertension 01/25/2008    Priority: Medium  . Hyperlipidemia 05/03/2007    Priority: Medium  . Family history of breast cancer     Priority: Low  . Allergic rhinitis 10/16/2014    Priority: Low  . History of skin cancer- follows with skin surgery center yearly  08/23/2013    Priority: Low  . Trigeminal neuralgia 01/19/2012    Priority: Low  . NEPHROLITHIASIS, RECURRENT 04/30/2010    Priority: Low  . Vitamin B12 deficiency neuropathy ([place]) 11/01/2008    Priority: Low   Past Surgical History:  Procedure Laterality Date  . COCHLEAR IMPLANT     left  . COLONOSCOPY    . KIDNEY STONE SURGERY    . nasal polyp removal bilateral    . skin cancer removal     2010-dermatology (sees derm regularly)    Family History  Problem Relation Age of  Onset  . Breast cancer Mother        dx in her 57s  . Diabetes Mother   . Hypertension Father   . Heart disease Father        lieftime smoker  . COPD Father   . Cancer Maternal Aunt        1 maternal aunt with cancer NOS in her late 24s  . Cancer Maternal Uncle        2 maternal uncles with cancer NOS  . Cancer Maternal Grandfather        cancer NOS  . Hodgkin's lymphoma Cousin     Medications- reviewed and updated Current Outpatient Medications  Medication Sig Dispense Refill  . aspirin 81 MG tablet Take 81 mg by mouth daily.      Marland Kitchen atorvastatin (LIPITOR) 40 MG tablet TAKE 1 TABLET BY MOUTH DAILY 90 tablet 3  . Cholecalciferol (VITAMIN D3) 2000 UNITS TABS Take 2,000 Units by mouth daily.      Marland Kitchen lisinopril (ZESTRIL) 20 MG tablet TAKE 1 TABLET BY MOUTH DAILY 90 tablet 3  . niacin 500 MG CR capsule Take 500 mg by mouth at bedtime.       No current facility-administered medications for this visit.    Allergies-reviewed and updated No Known Allergies  Social History   Social History Narrative  Married (wife Baker Janus patient of dr. Yong Channel). 3 children -2 twin girls and son, 35 grandchildren      Fully retired- worked for [employer] until age 76      Hobbies: formerly played tennis and runs now mostly grandkids time and yardwork      Objective:  BP 120/64   Pulse 69   Temp 97.8 F (36.6 C) (Temporal)   Ht 5' 7"  (1.702 m)   Wt 146 lb 3.2 oz (66.3 kg)   SpO2 97%   BMI 22.90 kg/m  Gen: NAD, resting comfortably HEENT: Mask not removed due to covid 19. TM normal. Bridge of nose normal. Eyelids normal.  Neck: no thyromegaly or cervical lymphadenopathy  CV: RRR no murmurs rubs or gallops Lungs: CTAB no crackles, wheeze, rhonchi Abdomen: soft/nontender/nondistended/normal bowel sounds. No rebound or guarding.  Ext: no edema Skin: warm, dry Neuro: grossly normal, moves all extremities, PERRLA     Assessment and Plan:  74 y.o. male presenting for annual physical.  Health  Maintenance counseling: 1. Anticipatory guidance: Patient counseled regarding regular dental exams - advised q6 months- he wants to wait until covid vaccine, eye exams - had in january,  avoiding smoking and second hand smoke , limiting alcohol to 2 beverages per day- 1-2 per week.   2. Risk factor reduction:  Advised patient of need for regular exercise and diet rich and fruits and vegetables to reduce risk of heart attack and stroke. Exercise- less in winter months- spends some time at beach and gets out and walks a few miles most days. Diet-reasonably healthy- would avoid further weight loss.  Wt Readings from Last 3 Encounters:  10/31/19 146 lb 3.2 oz (66.3 kg)  10/26/18 147 lb (66.7 kg)  10/25/17 150 lb 3.2 oz (68.1 kg)  3. Immunizations/screenings/ancillary studies-up-to-date other than Shingrix and COVID-19 vaccination-patient reports scheduled in [place] for march- we are going to hold on shingrix for now - had mild case with Dr. Arnoldo Morale in the past Immunization History  Administered Date(s) Administered  . Fluad Quad(high Dose 65+) 06/26/2019  . H1N1 09/23/2008  . Hep A / Hep B 09/23/2008, 01/17/2009, 04/11/2009  . Hepatitis A 09/23/2008  . Hepatitis B 09/23/2008  . Influenza Whole 07/24/2009, 07/21/2010, 07/02/2012  . Influenza, High Dose Seasonal PF 07/11/2017, 07/27/2018  . Influenza-Unspecified 07/04/2014, 07/06/2015, 07/07/2016  . Pneumococcal Conjugate-13 08/14/2013  . Pneumococcal Polysaccharide-23 06/19/2012  . Td 09/20/2001  . Tdap 10/16/2014  4. Prostate cancer screening-  Monoallelic mutation of PALB2 gene-increased risk of prostate cancer related to this.  We will continue to trend PSA for now despite being past age based screening recommended by [place] PTF Lab Results  Component Value Date   PSA 1.85 02/26/2019   PSA 2.39 10/26/2018   PSA 1.80 10/25/2017   5. Colon cancer screening - Cologuard done February 2019 with 3-year repeat needed 6. Skin cancer  screening/prevention- Dr. Pearline Cables with [facility]. advised regular sunscreen use. Denies worrisome, changing, or new skin lesions.  7. STD screening- patient opts out as monogamous 9.  Never smoker  Status of chronic or acute concerns   #Cochlear implant in place-non functioning essentially- does not help with voice.   #Hypertension S: Compliant with lisinopril 20 mg A/P: Excellent control blood pressure today-continue current medicine -Recommended every 21-monthvisit but he prefers yearly  #CKD stage III S: GFR has been stable around 60.  Blood pressure has been controlled, lipids controlled.  Knows to avoid NSAIDs- has taken sparingly A/P: Hopefully controlled-update CMP  today   #Hyperlipidemia S: Compliant with atorvastatin 40 mg.  He also prefers to continue aspirin for primary prevention. also takes niacin A/P: We discussed newer more aggressive LDL goal 70 or less-you prefer to stay on current dose of medicine even if LDL slightly high-only plan increase if this gets above 100  #Vitamin B12 deficiency/neuropathy S: Compliant with folbic in past but no longer covered by insurance.  Neuropathy largely resolved on B complex-rare tingling into both legs- no recent issues A/P: check b12 off b complex - hopefully maintaining . No prior leg neuropathy issues still present  # some stiff neck if reads in one position- suspect cervical arthritis. Could try heating pad  Recommended follow up: Return in about 1 year (around 10/30/2020) for annual wellness visit or sooner if needed.  Lab/Order associations: fasting   ICD-10-CM   1. Preventative health care  Z00.00   2. Monoallelic mutation of PALB2 gene  Z15.01 PSA   Z15.89    Z15.09   3. Cochlear implant in place  Z96.21   4. Hyperlipidemia, unspecified hyperlipidemia type  E78.5 CBC with Differential/Platelet    Comprehensive metabolic panel    Lipid panel  5. Essential hypertension  I10   6. Stage 3 chronic kidney disease,  unspecified whether stage 3a or 3b CKD  N18.30   7. Vitamin B12 deficiency neuropathy ([facility])  E53.8 Vitamin B12   G63    Return precautions advised.   Garret Reddish, MD

## 2019-10-31 ENCOUNTER — Encounter: Payer: Self-pay | Admitting: Family Medicine

## 2019-10-31 ENCOUNTER — Other Ambulatory Visit: Payer: Self-pay

## 2019-10-31 ENCOUNTER — Ambulatory Visit (INDEPENDENT_AMBULATORY_CARE_PROVIDER_SITE_OTHER): Payer: Medicare Other | Admitting: Family Medicine

## 2019-10-31 VITALS — BP 120/64 | HR 69 | Temp 97.8°F | Ht 67.0 in | Wt 146.2 lb

## 2019-10-31 DIAGNOSIS — E785 Hyperlipidemia, unspecified: Secondary | ICD-10-CM

## 2019-10-31 DIAGNOSIS — Z1501 Genetic susceptibility to malignant neoplasm of breast: Secondary | ICD-10-CM | POA: Diagnosis not present

## 2019-10-31 DIAGNOSIS — Z9621 Cochlear implant status: Secondary | ICD-10-CM

## 2019-10-31 DIAGNOSIS — N183 Chronic kidney disease, stage 3 unspecified: Secondary | ICD-10-CM

## 2019-10-31 DIAGNOSIS — I10 Essential (primary) hypertension: Secondary | ICD-10-CM

## 2019-10-31 DIAGNOSIS — G63 Polyneuropathy in diseases classified elsewhere: Secondary | ICD-10-CM | POA: Diagnosis not present

## 2019-10-31 DIAGNOSIS — E538 Deficiency of other specified B group vitamins: Secondary | ICD-10-CM | POA: Diagnosis not present

## 2019-10-31 DIAGNOSIS — Z0000 Encounter for general adult medical examination without abnormal findings: Secondary | ICD-10-CM

## 2019-10-31 DIAGNOSIS — Z1589 Genetic susceptibility to other disease: Secondary | ICD-10-CM

## 2019-10-31 DIAGNOSIS — Z1509 Genetic susceptibility to other malignant neoplasm: Secondary | ICD-10-CM | POA: Diagnosis not present

## 2019-10-31 LAB — CBC WITH DIFFERENTIAL/PLATELET
Basophils Absolute: 0.1 10*3/uL (ref 0.0–0.1)
Basophils Relative: 0.8 % (ref 0.0–3.0)
Eosinophils Absolute: 0.3 10*3/uL (ref 0.0–0.7)
Eosinophils Relative: 4 % (ref 0.0–5.0)
HCT: 47.1 % (ref 39.0–52.0)
Hemoglobin: 15.3 g/dL (ref 13.0–17.0)
Lymphocytes Relative: 25.8 % (ref 12.0–46.0)
Lymphs Abs: 2 10*3/uL (ref 0.7–4.0)
MCHC: 32.6 g/dL (ref 30.0–36.0)
MCV: 87.4 fl (ref 78.0–100.0)
Monocytes Absolute: 0.4 10*3/uL (ref 0.1–1.0)
Monocytes Relative: 5 % (ref 3.0–12.0)
Neutro Abs: 5.1 10*3/uL (ref 1.4–7.7)
Neutrophils Relative %: 64.4 % (ref 43.0–77.0)
Platelets: 209 10*3/uL (ref 150.0–400.0)
RBC: 5.39 Mil/uL (ref 4.22–5.81)
RDW: 12.9 % (ref 11.5–15.5)
WBC: 7.9 10*3/uL (ref 4.0–10.5)

## 2019-10-31 LAB — COMPREHENSIVE METABOLIC PANEL
ALT: 18 U/L (ref 0–53)
AST: 18 U/L (ref 0–37)
Albumin: 4.3 g/dL (ref 3.5–5.2)
Alkaline Phosphatase: 96 U/L (ref 39–117)
BUN: 22 mg/dL (ref 6–23)
CO2: 29 mEq/L (ref 19–32)
Calcium: 9.6 mg/dL (ref 8.4–10.5)
Chloride: 105 mEq/L (ref 96–112)
Creatinine, Ser: 1.28 mg/dL (ref 0.40–1.50)
GFR: 55.14 mL/min — ABNORMAL LOW (ref >60.00)
Glucose, Bld: 98 mg/dL (ref 70–99)
Potassium: 4.4 mEq/L (ref 3.5–5.1)
Sodium: 141 mEq/L (ref 135–145)
Total Bilirubin: 1.2 mg/dL (ref 0.2–1.2)
Total Protein: 6.8 g/dL (ref 6.0–8.3)

## 2019-10-31 LAB — LIPID PANEL
Cholesterol: 151 mg/dL (ref 0–200)
HDL: 39.8 mg/dL (ref >39.00)
LDL Cholesterol: 77 mg/dL (ref 0–99)
NonHDL: 111.6
Total CHOL/HDL Ratio: 4
Triglycerides: 172 mg/dL — ABNORMAL HIGH (ref 0.0–149.0)
VLDL: 34.4 mg/dL (ref 0.0–40.0)

## 2019-10-31 LAB — VITAMIN B12: Vitamin B-12: 354 pg/mL (ref 211–911)

## 2019-10-31 LAB — PSA: PSA: 2.39 ng/mL (ref 0.10–4.00)

## 2019-10-31 NOTE — Progress Notes (Signed)
Phone: [phone]    Subjective:  Patient presents today for their annual wellness visit (subsequent )  Preventive Screening-Counseling & Management  Modifiable Risk Factors/behavioral risk assessment/psychosocial risk assessment Regular exercise:  Less in winter months- does better in summer when at beach. Goal 150 mins/week discussed Diet: reasonably healthy  Wt Readings from Last 3 Encounters:  10/31/19 146 lb 3.2 oz (66.3 kg)  10/26/18 147 lb (66.7 kg)  10/25/17 150 lb 3.2 oz (68.1 kg)   Smoking Status: Never Smoker Second Hand Smoking status: No smokers in home Alcohol intake: 1-2  per week  Cardiac risk factors:  advanced age (older than 95 for men, 8 for women)  treated Hyperlipidemia  treated Hypertension  No diabetes.  No results found for: HGBA1C Family History: father with CAD but long term smoker   Depression Screen/risk evaluation Risk factors: none.Marland Kitchen PHQ2 0  Depression screen [facility] 2/9 10/31/2019 10/26/2018 10/25/2017 10/22/2016 10/21/2015  Decreased Interest 0 0 0 0 0  Down, Depressed, Hopeless 0 0 0 0 0  PHQ - 2 Score 0 0 0 0 0  Altered sleeping 0 - - - -  Tired, decreased energy 0 - - - -  Change in appetite 0 - - - -  Feeling bad or failure about yourself  0 - - - -  Trouble concentrating 0 - - - -  Moving slowly or fidgety/restless 0 - - - -  Suicidal thoughts 0 - - - -  PHQ-9 Score 0 - - - -  Difficult doing work/chores Not difficult at all - - - -    Functional ability and level of safety Mobility assessment:  timed get up and go <12 seconds Activities of Daily Living- Independent in ADLs (toileting, bathing, dressing, transferring, eating) and in IADLs (shopping, housekeeping, managing own medications, and handling finances) Home Safety: Loose rugs (no), smoke detectors (yes), small pets (no), grab bars (no could consider but has good set up for walk in), stairs (lives on main floor- just a few steps to get into house), life-alert system (no, but could  use phone) Hearing Difficulties: -patient endorses- uses cochlear implant but not functioning at level it should- not much else that can be done Fall Risk: None at present Fall Risk  10/31/2019 10/26/2018 10/25/2017 10/22/2016 10/21/2015  Falls in the past year? 0 0 No No No  Number falls in past yr: 0 0 - - -  Injury with Fall? 0 0 - - -  Risk for fall due to : History of fall(s) - - - -  Opioid use history:  no long term opioids use Self assessment of health status: good to excellent  Cognitive Testing             No reported trouble.   Mini cog: normal clock draw. 3/3 delayed recall. Normal test result    List the Names of Other Physician/Practitioners you currently use: Patient Care Team: Marin Olp, MD as PCP - General (Family Medicine) Macario Carls, MD as Referring Physician (Specialist) Alanda Slim Neena Rhymes, MD as Consulting Physician (Ophthalmology) Leta Baptist, MD as Consulting Physician (Otolaryngology) Dr. Pearline Cables dermatology Dr. Benjamine Mola as needed Needs new dentist  Required Immunizations needed today:  He is on wait list for covid 19.  Immunization History  Administered Date(s) Administered  . Fluad Quad(high Dose 65+) 06/26/2019  . H1N1 09/23/2008  . Hep A / Hep B 09/23/2008, 01/17/2009, 04/11/2009  . Hepatitis A 09/23/2008  . Hepatitis B 09/23/2008  . Influenza Whole  07/24/2009, 07/21/2010, 07/02/2012  . Influenza, High Dose Seasonal PF 07/11/2017, 07/27/2018  . Influenza-Unspecified 07/04/2014, 07/06/2015, 07/07/2016  . Pneumococcal Conjugate-13 08/14/2013  . Pneumococcal Polysaccharide-23 06/19/2012  . Td 09/20/2001  . Tdap 10/16/2014   Health Maintenance  Topic Date Due  . Cologuard (Stool DNA test)  11/15/2020  . Tetanus Vaccine  10/16/2024  . Flu Shot  Completed  .  Hepatitis C: One time screening is recommended by Center for Disease Control  (CDC) for  adults born from [birth year] through 1964.   Completed  . Pneumonia vaccines  Completed    Screening tests-     1. Colon cancer screening- cologuard 2019- repeat next year 2. Lung Cancer screening- not a candidate 3. Skin cancer screening- yearly with Dr. Pearline Cables 4. Prostate cancer screening- continue to check psa Lab Results  Component Value Date   PSA 1.85 02/26/2019   PSA 2.39 10/26/2018   PSA 1.80 10/25/2017   The following were reviewed and entered/updated in epic if appropriate: Past Medical History:  Diagnosis Date  . B12 deficiency anemia   . Diverticulosis   . Family history of breast cancer   . Hearing loss in left ear    essentially nonfunctioning cochlear implant  . Hearing loss of right ear   . Hyperlipidemia   . Hypertension    Patient Active Problem List   Diagnosis Date Noted  . Monoallelic mutation of PALB2 gene 09/26/2015    Priority: High  . Cochlear implant in place 12/21/2013    Priority: High  . CKD (chronic kidney disease), stage III 11/16/2011    Priority: Medium  . Essential hypertension 01/25/2008    Priority: Medium  . Hyperlipidemia 05/03/2007    Priority: Medium  . Family history of breast cancer     Priority: Low  . Allergic rhinitis 10/16/2014    Priority: Low  . History of skin cancer- follows with skin surgery center yearly  08/23/2013    Priority: Low  . Trigeminal neuralgia 01/19/2012    Priority: Low  . NEPHROLITHIASIS, RECURRENT 04/30/2010    Priority: Low  . Vitamin B12 deficiency neuropathy ([place]) 11/01/2008    Priority: Low   Past Surgical History:  Procedure Laterality Date  . COCHLEAR IMPLANT     left  . COLONOSCOPY    . KIDNEY STONE SURGERY    . nasal polyp removal bilateral    . skin cancer removal     2010-dermatology (sees derm regularly)    Family History  Problem Relation Age of Onset  . Breast cancer Mother        dx in her 14s  . Diabetes Mother   . Hypertension Father   . Heart disease Father        lieftime smoker  . COPD Father   . Cancer Maternal Aunt        1 maternal aunt with cancer NOS in her late 7s   . Cancer Maternal Uncle        2 maternal uncles with cancer NOS  . Cancer Maternal Grandfather        cancer NOS  . Hodgkin's lymphoma Cousin     Medications- reviewed and updated Current Outpatient Medications  Medication Sig Dispense Refill  . aspirin 81 MG tablet Take 81 mg by mouth daily.      Marland Kitchen atorvastatin (LIPITOR) 40 MG tablet TAKE 1 TABLET BY MOUTH DAILY 90 tablet 3  . Cholecalciferol (VITAMIN D3) 2000 UNITS TABS Take 2,000 Units by mouth daily.      Marland Kitchen  lisinopril (ZESTRIL) 20 MG tablet TAKE 1 TABLET BY MOUTH DAILY 90 tablet 3  . niacin 500 MG CR capsule Take 500 mg by mouth at bedtime.       No current facility-administered medications for this visit.    Allergies-reviewed and updated No Known Allergies  Social History   Socioeconomic History  . Marital status: Married    Spouse name: Not on file  . Number of children: 3  . Years of education: Not on file  . Highest education level: Not on file  Occupational History  . Not on file  Tobacco Use  . Smoking status: Never Smoker  . Smokeless tobacco: Never Used  Substance and Sexual Activity  . Alcohol use: Yes    Alcohol/week: 0.0 standard drinks    Comment: 1 a month  . Drug use: No  . Sexual activity: Yes  Other Topics Concern  . Not on file  Social History Narrative   Married (wife Baker Janus patient of dr. Yong Channel). 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 Difficulty of Paying Living Expenses: Not on file  Food Insecurity:   . Worried About Charity fundraiser in the Last Year: Not on file  . Ran Out of Food in the Last Year: Not on file  Transportation Needs:   . Lack of Transportation (Medical): Not on file  . Lack of Transportation (Non-Medical): Not on file  Physical Activity:   . Days of  Exercise per Week: Not on file  . Minutes of Exercise per Session: Not on file  Stress:   . Feeling of Stress : Not on file  Social Connections:   . Frequency of Communication with Friends and Family: Not on file  . Frequency of Social Gatherings with Friends and Family: Not on file  . Attends Religious Services: Not on file  . Active Member of Clubs or Organizations: Not on file  . Attends Archivist Meetings: Not on file  . Marital Status: Not on file      Objective:  BP 120/64   Pulse 69   Temp 97.8 F (36.6 C) (Temporal)   Ht _0  (1.702 m)   Wt 146 lb 3.2 oz (66.3 kg)   SpO2 97%   BMI 22.90 kg/m  Gen: NAD, resting comfortably    Assessment/Plan:  AWV completed 1. Educated, counseled and referred based on above elements 2. Educated, counseled and referred as appropriate for preventative needs 3. Discussed and documented a written plan for preventiative services and screenings with personalized health advice- After Visit Summary was given to patient which included this plan   Status of chronic or acute concerns  See separate problem oriented visit  Recommended follow up: Return in about 1 year (around 10/30/2020) for physical or sooner if needed.  Lab/Order associations:   ICD-10-CM   1. Preventative health care  Z00.00    Return precautions advised. Garret Reddish, MD

## 2019-10-31 NOTE — Patient Instructions (Addendum)
Please stop by lab before you go If you do not have mychart- we will call you about results within 5 business days of [place] receiving them.  If you have mychart- we will send your results within 3 business days of [place] receiving them.  If abnormal or we want to clarify a result, we will call or mychart you to make sure you receive the message.  If you have questions or concerns or don't hear within 5-7 days, please send [place] a message or call us.    Bryan Carson , Thank you for taking time to come for your Medicare Wellness Visit. I appreciate your ongoing commitment to your health goals. Please review the following plan we discussed and let me know if I can assist you in the future.   These are the goals we discussed: 1. Exercise 150 minutes a week even in the winter if you can   This is a list of the screening recommended for you and due dates:  Health Maintenance  Topic Date Due  . Cologuard (Stool DNA test)  11/15/2020  . Tetanus Vaccine  10/16/2024  . Flu Shot  Completed  .  Hepatitis C: One time screening is recommended by Center for Disease Control  (CDC) for  adults born from [birth year] through 1964.   Completed  . Pneumonia vaccines  Completed    Recommended follow up: Return in about 1 year (around 10/30/2020) for physical or sooner if needed.

## 2019-11-04 ENCOUNTER — Ambulatory Visit: Payer: Medicare Other | Attending: Internal Medicine

## 2019-11-04 DIAGNOSIS — Z23 Encounter for immunization: Secondary | ICD-10-CM | POA: Insufficient documentation

## 2019-11-04 NOTE — Progress Notes (Signed)
   Covid-19 Vaccination Clinic  Name:  Bryan Carson    MRN: [id number] DOB: 1946-05-21  11/04/2019  [name] was observed post Covid-19 immunization for 15 minutes without incidence. He was provided with Vaccine Information Sheet and instruction to access the V-Safe system.   [name] was instructed to call 911 with any severe reactions post vaccine: Marland Kitchen Difficulty breathing  . Swelling of your face and throat  . A fast heartbeat  . A bad rash all over your body  . Dizziness and weakness    Immunizations Administered    Name Date Dose VIS Date Route   Pfizer COVID-19 Vaccine 11/04/2019 12:16 PM 0.3 mL 08/31/2019 Intramuscular   Manufacturer: [place]   Lot: [id number]   [place]: [id number]

## 2019-11-27 ENCOUNTER — Ambulatory Visit: Payer: Medicare Other | Attending: Internal Medicine

## 2019-11-27 DIAGNOSIS — Z23 Encounter for immunization: Secondary | ICD-10-CM | POA: Insufficient documentation

## 2019-11-27 NOTE — Progress Notes (Signed)
   Covid-19 Vaccination Clinic  Name:  Bryan Carson    MRN: [id number] DOB: 10-26-1945  11/27/2019  [name] was observed post Covid-19 immunization for 15 minutes without incident. He was provided with Vaccine Information Sheet and instruction to access the V-Safe system.   [name] was instructed to call 911 with any severe reactions post vaccine: Marland Kitchen Difficulty breathing  . Swelling of face and throat  . A fast heartbeat  . A bad rash all over body  . Dizziness and weakness   Immunizations Administered    Name Date Dose VIS Date Route   Pfizer COVID-19 Vaccine 11/27/2019  4:53 PM 0.3 mL 08/31/2019 Intramuscular   Manufacturer: [place]   Lot: [id number]   [place]: [id number]

## 2019-12-11 ENCOUNTER — Telehealth: Payer: Self-pay

## 2019-12-11 NOTE — Telephone Encounter (Signed)
lvm regarding flu vaccine. 

## 2019-12-13 ENCOUNTER — Encounter: Payer: Self-pay | Admitting: Family Medicine

## 2020-04-25 NOTE — Patient Instructions (Addendum)
Please stop by lab before you go If you have mychart- we will send your results within 3 business days of [place] receiving them.  If you do not have mychart- we will call you about results within 5 business days of [place] receiving them.  *please note we are currently using [facility] which has a longer processing time than [place] typically so labs may not come back as quickly as in the past *please also note that you will see labs on mychart as soon as they post. I will later go in and write notes on them- will say "notes from Dr. Yong Channel"  Health Maintenance Due  Topic Date Due  . INFLUENZA VACCINE -please update [place] when you receive your flu shot 04/20/2020   Baker Janus is probably right- some ibuprofen probably wont hurt but its worth trying voltaren gel first if you can as we know that doesn't get to the kidneys as much and is basically ibuprofen in gel form  No changes today

## 2020-04-25 NOTE — Progress Notes (Signed)
Phone [phone] In person visit   Subjective:   Bryan Carson is a 74 y.o. year old very pleasant male patient who presents for/with See problem oriented charting Chief Complaint  Patient presents with  . Follow-up   This visit occurred during the SARS-CoV-2 public health emergency.  Safety protocols were in place, including screening questions prior to the visit, additional usage of staff PPE, and extensive cleaning of exam room while observing appropriate contact time as indicated for disinfecting solutions.   Past Medical History-  Patient Active Problem List   Diagnosis Date Noted  . Monoallelic mutation of PALB2 gene 09/26/2015    Priority: High  . Cochlear implant in place 12/21/2013    Priority: High  . CKD (chronic kidney disease), stage III 11/16/2011    Priority: Medium  . Essential hypertension 01/25/2008    Priority: Medium  . Hyperlipidemia 05/03/2007    Priority: Medium  . Family history of breast cancer     Priority: Low  . Allergic rhinitis 10/16/2014    Priority: Low  . History of skin cancer- follows with skin surgery center yearly  08/23/2013    Priority: Low  . Trigeminal neuralgia 01/19/2012    Priority: Low  . NEPHROLITHIASIS, RECURRENT 04/30/2010    Priority: Low  . Vitamin B12 deficiency neuropathy ([place]) 11/01/2008    Priority: Low    Medications- reviewed and updated Current Outpatient Medications  Medication Sig Dispense Refill  . aspirin 81 MG tablet Take 81 mg by mouth daily.      Marland Kitchen atorvastatin (LIPITOR) 40 MG tablet TAKE 1 TABLET BY MOUTH DAILY 90 tablet 3  . Cholecalciferol (VITAMIN D3) 2000 UNITS TABS Take 2,000 Units by mouth daily.      Marland Kitchen lisinopril (ZESTRIL) 20 MG tablet TAKE 1 TABLET BY MOUTH DAILY 90 tablet 3  . niacin 500 MG CR capsule Take 500 mg by mouth at bedtime.       No current facility-administered medications for this visit.     Objective:  BP 122/74   Pulse 77   Temp 98.5 F (36.9 C)   Ht 5' 7"  (1.702 m)    Wt 146 lb 6.4 oz (66.4 kg)   SpO2 98%   BMI 22.93 kg/m  Gen: NAD, resting comfortably CV: RRR no murmurs rubs or gallops Lungs: CTAB no crackles, wheeze, rhonchi Abdomen: soft/nontender/nondistended Ext: no edema Skin: warm, dry     Assessment and Plan   #Elevated PSA-PSA stable from 2020 to 2021 year over a year though have increased previously and come back down June 2020-patient prefers to do another PSA check today be on the safe side due to underlying genetic mutation Lab Results  Component Value Date   PSA 2.39 10/31/2019   PSA 1.85 02/26/2019   PSA 2.39 10/26/2018   #Hypertension S: Compliant with lisinopril 20 mg  BP Readings from Last 3 Encounters:  04/29/20 122/74  10/31/19 120/64  10/26/18 120/60   A/P: Excellent control-continue current medication  #CKD stage III S: GFR has been stable around 60.  Blood pressure has been controlled, lipids controlled.  Knows to avoid NSAIDs. A/P: Hopefully controlled-update cMP with labs   #Hyperlipidemia S: Compliant with atorvastatin 40 mg.  He also prefers to continue aspirin for primary prevention. also takes niacin Lab Results  Component Value Date   CHOL 151 10/31/2019   HDL 39.80 10/31/2019   LDLCALC 77 10/31/2019   LDLDIRECT 65.3 01/29/2010   TRIG 172.0 (H) 10/31/2019   CHOLHDL 4 10/31/2019  A/P: LDL slightly above ideal goal of 70 or less at 77-focus on healthy eating/regular exercise and recheck at physical in 6 months  #Vitamin B12 deficiency/neuropathy S: Compliant with folbic in past but no longer covered by insurance.  Neuropathy largely resolved on B complex-rare tingling into both legs- no recent issues  Lab Results  Component Value Date   VITAMINB12 354 10/31/2019   A/P: Would continue B complex long-term-B12 remains on lower side of normal in February   Recommended follow up: keep cpe and awv in february Future Appointments  Date Time Provider [place]  11/03/2020 10:40 AM Marin Olp, MD [facility]    Lab/Order associations:   ICD-10-CM   1. Screening for prostate cancer  Z12.5 PSA  2. Monoallelic mutation of PALB2 gene  Z15.01    Z15.89    Z15.09   3. Hyperlipidemia, unspecified hyperlipidemia type  E78.5   4. Essential hypertension  I10   5. Stage 3 chronic kidney disease, unspecified whether stage 3a or 3b CKD  D25.24 COMPLETE METABOLIC PANEL WITH GFR  6. Vitamin B12 deficiency neuropathy (HCC)  E53.8    G63    Return precautions advised.  Garret Reddish, MD

## 2020-04-29 ENCOUNTER — Ambulatory Visit (INDEPENDENT_AMBULATORY_CARE_PROVIDER_SITE_OTHER): Payer: Medicare Other | Admitting: Family Medicine

## 2020-04-29 ENCOUNTER — Other Ambulatory Visit: Payer: Self-pay | Admitting: Family Medicine

## 2020-04-29 ENCOUNTER — Encounter: Payer: Self-pay | Admitting: Family Medicine

## 2020-04-29 ENCOUNTER — Other Ambulatory Visit: Payer: Self-pay

## 2020-04-29 VITALS — BP 122/74 | HR 77 | Temp 98.5°F | Ht 67.0 in | Wt 146.4 lb

## 2020-04-29 DIAGNOSIS — E538 Deficiency of other specified B group vitamins: Secondary | ICD-10-CM

## 2020-04-29 DIAGNOSIS — E785 Hyperlipidemia, unspecified: Secondary | ICD-10-CM | POA: Diagnosis not present

## 2020-04-29 DIAGNOSIS — Z1589 Genetic susceptibility to other disease: Secondary | ICD-10-CM

## 2020-04-29 DIAGNOSIS — G63 Polyneuropathy in diseases classified elsewhere: Secondary | ICD-10-CM

## 2020-04-29 DIAGNOSIS — N183 Chronic kidney disease, stage 3 unspecified: Secondary | ICD-10-CM | POA: Diagnosis not present

## 2020-04-29 DIAGNOSIS — Z1501 Genetic susceptibility to malignant neoplasm of breast: Secondary | ICD-10-CM

## 2020-04-29 DIAGNOSIS — Z125 Encounter for screening for malignant neoplasm of prostate: Secondary | ICD-10-CM

## 2020-04-29 DIAGNOSIS — I10 Essential (primary) hypertension: Secondary | ICD-10-CM | POA: Diagnosis not present

## 2020-04-29 DIAGNOSIS — Z1509 Genetic susceptibility to other malignant neoplasm: Secondary | ICD-10-CM

## 2020-04-29 LAB — COMPLETE METABOLIC PANEL WITH GFR
AG Ratio: 1.9 (calc) (ref 1.0–2.5)
ALT: 17 U/L (ref 9–46)
AST: 17 U/L (ref 10–35)
Albumin: 4.3 g/dL (ref 3.6–5.1)
Alkaline phosphatase (APISO): 96 U/L (ref 35–144)
BUN/Creatinine Ratio: 12 (calc) (ref 6–22)
BUN: 16 mg/dL (ref 7–25)
CO2: 29 mmol/L (ref 20–32)
Calcium: 9.4 mg/dL (ref 8.6–10.3)
Chloride: 103 mmol/L (ref 98–110)
Creat: 1.39 mg/dL — ABNORMAL HIGH (ref 0.70–1.18)
GFR, Est African American: 58 mL/min/{1.73_m2} — ABNORMAL LOW (ref >=60)
GFR, Est Non African American: 50 mL/min/{1.73_m2} — ABNORMAL LOW (ref >=60)
Globulin: 2.3 g/dL (calc) (ref 1.9–3.7)
Glucose, Bld: 90 mg/dL (ref 65–99)
Potassium: 4.6 mmol/L (ref 3.5–5.3)
Sodium: 140 mmol/L (ref 135–146)
Total Bilirubin: 1.4 mg/dL — ABNORMAL HIGH (ref 0.2–1.2)
Total Protein: 6.6 g/dL (ref 6.1–8.1)

## 2020-04-29 LAB — PSA: PSA: 1.6 ng/mL (ref <=4.0)

## 2020-04-29 NOTE — Addendum Note (Signed)
Addended by: Thomes Lolling on: 04/29/2020 11:28 AM   Modules accepted: Orders

## 2020-06-05 DIAGNOSIS — H903 Sensorineural hearing loss, bilateral: Secondary | ICD-10-CM | POA: Diagnosis not present

## 2020-06-10 DIAGNOSIS — Z45321 Encounter for adjustment and management of cochlear device: Secondary | ICD-10-CM | POA: Diagnosis not present

## 2020-06-10 DIAGNOSIS — Z9621 Cochlear implant status: Secondary | ICD-10-CM | POA: Diagnosis not present

## 2020-06-10 DIAGNOSIS — H903 Sensorineural hearing loss, bilateral: Secondary | ICD-10-CM | POA: Diagnosis not present

## 2020-07-21 DIAGNOSIS — H5213 Myopia, bilateral: Secondary | ICD-10-CM | POA: Diagnosis not present

## 2020-07-21 DIAGNOSIS — H04123 Dry eye syndrome of bilateral lacrimal glands: Secondary | ICD-10-CM | POA: Diagnosis not present

## 2020-07-22 DIAGNOSIS — L853 Xerosis cutis: Secondary | ICD-10-CM | POA: Diagnosis not present

## 2020-07-22 DIAGNOSIS — D1801 Hemangioma of skin and subcutaneous tissue: Secondary | ICD-10-CM | POA: Diagnosis not present

## 2020-07-22 DIAGNOSIS — L905 Scar conditions and fibrosis of skin: Secondary | ICD-10-CM | POA: Diagnosis not present

## 2020-07-22 DIAGNOSIS — Z85828 Personal history of other malignant neoplasm of skin: Secondary | ICD-10-CM | POA: Diagnosis not present

## 2020-07-24 ENCOUNTER — Ambulatory Visit (INDEPENDENT_AMBULATORY_CARE_PROVIDER_SITE_OTHER): Payer: Medicare Other

## 2020-07-24 ENCOUNTER — Encounter: Payer: Self-pay | Admitting: Family Medicine

## 2020-07-24 ENCOUNTER — Other Ambulatory Visit: Payer: Self-pay

## 2020-07-24 DIAGNOSIS — Z23 Encounter for immunization: Secondary | ICD-10-CM

## 2020-09-03 DIAGNOSIS — H903 Sensorineural hearing loss, bilateral: Secondary | ICD-10-CM | POA: Diagnosis not present

## 2020-09-03 DIAGNOSIS — Z45328 Encounter for adjustment and management of other implanted hearing device: Secondary | ICD-10-CM | POA: Diagnosis not present

## 2020-09-03 DIAGNOSIS — Z9621 Cochlear implant status: Secondary | ICD-10-CM | POA: Diagnosis not present

## 2020-10-31 ENCOUNTER — Encounter: Payer: Medicare Other | Admitting: Family Medicine

## 2020-11-01 NOTE — Progress Notes (Signed)
Phone: [phone]   Subjective:  Patient presents today for their annual physical. Chief complaint-noted.   See problem oriented charting- ROS- full  review of systems was completed and negative  except for: left knee pain occasional- copper infused braces help, neck stiffness, hearing loss with cochlear implant, dizziness if bends over and stands up quickly pretty stable- since trigeminal nerve surgery  The following were reviewed and entered/updated in epic: Past Medical History:  Diagnosis Date  . B12 deficiency anemia   . Diverticulosis   . Family history of breast cancer   . Hearing loss in left ear    essentially nonfunctioning cochlear implant  . Hearing loss of right ear   . Hyperlipidemia   . Hypertension    Patient Active Problem List   Diagnosis Date Noted  . Monoallelic mutation of PALB2 gene 09/26/2015    Priority: High  . Cochlear implant in place 12/21/2013    Priority: High  . CKD (chronic kidney disease), stage III ([place]) 11/16/2011    Priority: Medium  . Essential hypertension 01/25/2008    Priority: Medium  . Hyperlipidemia 05/03/2007    Priority: Medium  . Family history of breast cancer     Priority: Low  . Allergic rhinitis 10/16/2014    Priority: Low  . History of skin cancer- follows with skin surgery center yearly  08/23/2013    Priority: Low  . Trigeminal neuralgia 01/19/2012    Priority: Low  . NEPHROLITHIASIS, RECURRENT 04/30/2010    Priority: Low  . Vitamin B12 deficiency neuropathy ([place]) 11/01/2008    Priority: Low   Past Surgical History:  Procedure Laterality Date  . COCHLEAR IMPLANT     left  . COLONOSCOPY    . KIDNEY STONE SURGERY    . nasal polyp removal bilateral    . skin cancer removal     2010-dermatology (sees derm regularly)    Family History  Problem Relation Age of Onset  . Breast cancer Mother        dx in her 22s  . Diabetes Mother   . Hypertension Father   . Heart disease Father        lieftime smoker  .  COPD Father   . Cancer Maternal Aunt        1 maternal aunt with cancer NOS in her late 36s  . Cancer Maternal Uncle        2 maternal uncles with cancer NOS  . Cancer Maternal Grandfather        cancer NOS  . Hodgkin's lymphoma Cousin     Medications- reviewed and updated Current Outpatient Medications  Medication Sig Dispense Refill  . aspirin 81 MG tablet Take 81 mg by mouth daily.    Marland Kitchen atorvastatin (LIPITOR) 40 MG tablet TAKE 1 TABLET BY MOUTH DAILY 90 tablet 3  . Cholecalciferol (VITAMIN D3) 2000 UNITS TABS Take 2,000 Units by mouth daily.    Marland Kitchen lisinopril (ZESTRIL) 20 MG tablet TAKE 1 TABLET BY MOUTH DAILY 90 tablet 3  . niacin 500 MG CR capsule Take 500 mg by mouth at bedtime.     No current facility-administered medications for this visit.    Allergies-reviewed and updated No Known Allergies  Social History   Social History Narrative   Married (wife Baker Janus patient of dr. Yong Channel). 3 children -2 twin girls and son, 44 grandchildren      Fully retired- worked for [employer] until age 45      Hobbies: formerly played tennis and runs  now mostly grandkids time and yardwork   Objective  Objective:  BP 130/82   Pulse 67   Temp 97.9 F (36.6 C) (Temporal)   Ht 5' 7"  (1.702 m)   Wt 144 lb 6.4 oz (65.5 kg)   SpO2 99%   BMI 22.62 kg/m  Gen: NAD, resting comfortably HEENT: Mucous membranes are moist. Oropharynx normal. TM normal on right when removing hearing aide, cochlear implant on left Neck: no thyromegaly CV: RRR no murmurs rubs or gallops Lungs: CTAB no crackles, wheeze, rhonchi Abdomen: soft/nontender/nondistended/normal bowel sounds. No rebound or guarding.  Ext: no edema Skin: warm, dry Neuro: grossly normal, moves all extremities, PERRLA    Assessment and Plan  75 y.o. male presenting for annual physical.  Health Maintenance counseling: 1. Anticipatory guidance: Patient counseled regarding regular dental exams -q6 months advised- looking for new dentist, eye  exams -yearly,  avoiding smoking and second hand smoke , limiting alcohol to 2 beverages per day usually 1-2 per month- maybe more if at beach.   2. Risk factor reduction:  Advised patient of need for regular exercise and diet rich and fruits and vegetables to reduce risk of heart attack and stroke. Exercise- less in winter- doesn't like the cold- in sarmer months walking and cycling at beach and a lot of yardwork here. Diet-reasonably healthy.  Wt Readings from Last 3 Encounters:  11/03/20 144 lb 6.4 oz (65.5 kg)  04/29/20 146 lb 6.4 oz (66.4 kg)  10/31/19 146 lb 3.2 oz (66.3 kg)  3. Immunizations/screenings/ancillary studies- discussed shingrix at pharmacy - had case in past with Dr. Arnoldo Morale- wants to hold off for now Immunization History  Administered Date(s) Administered  . Fluad Quad(high Dose 65+) 06/26/2019, 07/24/2020  . H1N1 09/23/2008  . Hep A / Hep B 09/23/2008, 01/17/2009, 04/11/2009  . Hepatitis A 09/23/2008  . Hepatitis B 09/23/2008  . Influenza Whole 07/24/2009, 07/21/2010, 07/02/2012  . Influenza, High Dose Seasonal PF 07/11/2017, 07/27/2018  . Influenza-Unspecified 07/04/2014, 07/06/2015, 07/07/2016  . PFIZER(Purple Top)SARS-COV-2 Vaccination 11/04/2019, 11/27/2019, 08/13/2020  . Pneumococcal Conjugate-13 08/14/2013  . Pneumococcal Polysaccharide-23 06/19/2012  . Td 09/20/2001  . Tdap 10/16/2014  4. Prostate cancer screening- monoallelic mutation of PALB2 gene- PSA trend reassuring last year- increased frequency- update PSA today Lab Results  Component Value Date   PSA 1.6 04/29/2020   PSA 2.39 10/31/2019   PSA 1.85 02/26/2019   5. Colon cancer screening - feb 2019- repeat at this point 6. Skin cancer screening- sees dermatology- last seen November.  7. Never smoker 8. STD screening - monogomous  Status of chronic or acute concerns   #Hypertension S: Compliant with lisinopril 20 mg Home checks usually 120s or 130s but not over 140s over 70s or 80s BP Readings  from Last 3 Encounters:  11/03/20 130/82  04/29/20 122/74  10/31/19 120/64  A/P: Stable. Continue current medications.   #CKD stage III S: GFR has been stable around 60.  Blood pressure has been controlled, lipids controlled.  Knows to avoid NSAIDs. A/P:  hopefully stable- update CMP today.   #Hyperlipidemia S: Compliant with atorvastatin 40 mg.  He also prefers to continue aspirin for primary prevention. also takes niacin Lab Results  Component Value Date   CHOL 151 10/31/2019   HDL 39.80 10/31/2019   LDLCALC 77 10/31/2019   LDLDIRECT 65.3 01/29/2010   TRIG 172.0 (H) 10/31/2019   CHOLHDL 4 10/31/2019  A/P:  ideal LDL on recheck last year- update lipid panel today. Discussed slight increase in DM  risk    #Vitamin B12 deficiency/neuropathy S: Compliant with folbic in past but no longer covered by insurance.  Neuropathy largely resolved on B complex-rare tingling into both legs but gets some in his feet- no recent worsening issues  He is off b12 at moment Lab Results  Component Value Date   VITAMINB12 354 10/31/2019  A/P:  Hopefully stable- update b12 today  Recommended follow up: Return in about 6 months (around 05/03/2021) for follow up- or sooner if needed.  Lab/Order associations: fasting   ICD-10-CM   1. Preventative health care  Z00.00   2. Essential hypertension  I10   3. Vitamin B12 deficiency neuropathy (HCC)  E53.8    G63   4. Hyperlipidemia, unspecified hyperlipidemia type  E78.5   5. Screening for prostate cancer  Z12.5     No orders of the defined types were placed in this encounter.   Return precautions advised.  Garret Reddish, MD

## 2020-11-01 NOTE — Patient Instructions (Addendum)
Please stop by lab before you go If you have mychart- we will send your results within 3 business days of [place] receiving them.  If you do not have mychart- we will call you about results within 5 business days of [place] receiving them.  *please also note that you will see labs on mychart as soon as they post. I will later go in and write notes on them- will say "notes from Dr. Yong Channel"  You are eligible to schedule your annual wellness visit with our nurse specialist Otila Kluver.  Please consider scheduling this before you leave today. I used to do these on same day of visits but insurance has made this increasingly complicated and will not always cover on the same day.   Recommended follow up: Return in about 6 months (around 05/03/2021) for follow up- or sooner if needed. I like every 6 months to check on your kidneys- you have preferred yearly physicals.

## 2020-11-03 ENCOUNTER — Other Ambulatory Visit: Payer: Self-pay

## 2020-11-03 ENCOUNTER — Encounter: Payer: Self-pay | Admitting: Family Medicine

## 2020-11-03 ENCOUNTER — Ambulatory Visit (INDEPENDENT_AMBULATORY_CARE_PROVIDER_SITE_OTHER): Payer: Medicare Other | Admitting: Family Medicine

## 2020-11-03 VITALS — BP 130/82 | HR 67 | Temp 97.9°F | Ht 67.0 in | Wt 144.4 lb

## 2020-11-03 DIAGNOSIS — Z125 Encounter for screening for malignant neoplasm of prostate: Secondary | ICD-10-CM

## 2020-11-03 DIAGNOSIS — E785 Hyperlipidemia, unspecified: Secondary | ICD-10-CM | POA: Diagnosis not present

## 2020-11-03 DIAGNOSIS — E538 Deficiency of other specified B group vitamins: Secondary | ICD-10-CM | POA: Diagnosis not present

## 2020-11-03 DIAGNOSIS — I10 Essential (primary) hypertension: Secondary | ICD-10-CM | POA: Diagnosis not present

## 2020-11-03 DIAGNOSIS — G63 Polyneuropathy in diseases classified elsewhere: Secondary | ICD-10-CM

## 2020-11-03 DIAGNOSIS — Z1211 Encounter for screening for malignant neoplasm of colon: Secondary | ICD-10-CM

## 2020-11-03 DIAGNOSIS — Z0000 Encounter for general adult medical examination without abnormal findings: Secondary | ICD-10-CM | POA: Diagnosis not present

## 2020-11-03 LAB — COMPREHENSIVE METABOLIC PANEL
ALT: 17 U/L (ref 0–53)
AST: 14 U/L (ref 0–37)
Albumin: 4.3 g/dL (ref 3.5–5.2)
Alkaline Phosphatase: 90 U/L (ref 39–117)
BUN: 17 mg/dL (ref 6–23)
CO2: 28 mEq/L (ref 19–32)
Calcium: 9.8 mg/dL (ref 8.4–10.5)
Chloride: 105 mEq/L (ref 96–112)
Creatinine, Ser: 1.28 mg/dL (ref 0.40–1.50)
GFR: 55.45 mL/min — ABNORMAL LOW (ref >60.00)
Glucose, Bld: 94 mg/dL (ref 70–99)
Potassium: 4.3 mEq/L (ref 3.5–5.1)
Sodium: 139 mEq/L (ref 135–145)
Total Bilirubin: 1.4 mg/dL — ABNORMAL HIGH (ref 0.2–1.2)
Total Protein: 6.8 g/dL (ref 6.0–8.3)

## 2020-11-03 LAB — CBC WITH DIFFERENTIAL/PLATELET
Basophils Absolute: 0.1 10*3/uL (ref 0.0–0.1)
Basophils Relative: 0.8 % (ref 0.0–3.0)
Eosinophils Absolute: 0.4 10*3/uL (ref 0.0–0.7)
Eosinophils Relative: 5.5 % — ABNORMAL HIGH (ref 0.0–5.0)
HCT: 44.5 % (ref 39.0–52.0)
Hemoglobin: 15 g/dL (ref 13.0–17.0)
Lymphocytes Relative: 23 % (ref 12.0–46.0)
Lymphs Abs: 1.7 10*3/uL (ref 0.7–4.0)
MCHC: 33.7 g/dL (ref 30.0–36.0)
MCV: 87.2 fl (ref 78.0–100.0)
Monocytes Absolute: 0.4 10*3/uL (ref 0.1–1.0)
Monocytes Relative: 5.7 % (ref 3.0–12.0)
Neutro Abs: 4.8 10*3/uL (ref 1.4–7.7)
Neutrophils Relative %: 65 % (ref 43.0–77.0)
Platelets: 198 10*3/uL (ref 150.0–400.0)
RBC: 5.1 Mil/uL (ref 4.22–5.81)
RDW: 13.4 % (ref 11.5–15.5)
WBC: 7.4 10*3/uL (ref 4.0–10.5)

## 2020-11-03 LAB — LIPID PANEL
Cholesterol: 152 mg/dL (ref 0–200)
HDL: 46 mg/dL (ref >39.00)
LDL Cholesterol: 79 mg/dL (ref 0–99)
NonHDL: 105.88
Total CHOL/HDL Ratio: 3
Triglycerides: 133 mg/dL (ref 0.0–149.0)
VLDL: 26.6 mg/dL (ref 0.0–40.0)

## 2020-11-03 LAB — VITAMIN B12: Vitamin B-12: 212 pg/mL (ref 211–911)

## 2020-11-03 LAB — PSA: PSA: 1.97 ng/mL (ref 0.10–4.00)

## 2020-11-12 ENCOUNTER — Other Ambulatory Visit: Payer: Self-pay | Admitting: Family Medicine

## 2020-11-13 DIAGNOSIS — R69 Illness, unspecified: Secondary | ICD-10-CM | POA: Diagnosis not present

## 2020-12-23 ENCOUNTER — Encounter: Payer: Self-pay | Admitting: Family Medicine

## 2020-12-24 ENCOUNTER — Other Ambulatory Visit: Payer: Self-pay

## 2020-12-24 MED ORDER — LISINOPRIL 20 MG PO TABS
1.0000 | ORAL_TABLET | Freq: Every day | ORAL | 0 refills | Status: DC
Start: 2020-12-24 — End: 2021-07-28

## 2020-12-24 MED ORDER — ATORVASTATIN CALCIUM 40 MG PO TABS
1.0000 | ORAL_TABLET | Freq: Every day | ORAL | 0 refills | Status: DC
Start: 2020-12-24 — End: 2021-07-28

## 2021-01-09 ENCOUNTER — Other Ambulatory Visit: Payer: Self-pay

## 2021-01-09 DIAGNOSIS — Z1211 Encounter for screening for malignant neoplasm of colon: Secondary | ICD-10-CM

## 2021-01-20 ENCOUNTER — Other Ambulatory Visit: Payer: Self-pay

## 2021-01-20 DIAGNOSIS — Z1211 Encounter for screening for malignant neoplasm of colon: Secondary | ICD-10-CM

## 2021-01-27 ENCOUNTER — Telehealth: Payer: Self-pay

## 2021-01-27 NOTE — Telephone Encounter (Signed)
cologuard has been re ordered for pt.

## 2021-01-27 NOTE — Telephone Encounter (Signed)
-----   Message from Marin Olp, MD sent at 01/14/2021  1:25 PM EDT ----- Can you check in on this not completed Cologuard please? ----- Message ----- From: SYSTEM Sent: 01/14/2021  12:13 AM EDT To: Marin Olp, MD

## 2021-02-02 DIAGNOSIS — Z1211 Encounter for screening for malignant neoplasm of colon: Secondary | ICD-10-CM | POA: Diagnosis not present

## 2021-02-11 LAB — COLOGUARD
COLOGUARD: NEGATIVE
Cologuard: NEGATIVE

## 2021-02-11 LAB — EXTERNAL GENERIC LAB PROCEDURE: COLOGUARD: NEGATIVE

## 2021-02-12 ENCOUNTER — Encounter: Payer: Self-pay | Admitting: Family Medicine

## 2021-05-01 NOTE — Progress Notes (Signed)
Phone [phone] In person visit   Subjective:   Bryan Carson is a 75 y.o. year old very pleasant male patient who presents for/with See problem oriented charting Chief Complaint  Patient presents with   Hypertension   Hyperlipidemia   Chronic Kidney Disease    Stage 3     This visit occurred during the SARS-CoV-2 public health emergency.  Safety protocols were in place, including screening questions prior to the visit, additional usage of staff PPE, and extensive cleaning of exam room while observing appropriate contact time as indicated for disinfecting solutions.   Past Medical History-  Patient Active Problem List   Diagnosis Date Noted   Monoallelic mutation of PALB2 gene 09/26/2015    Priority: High   Cochlear implant in place 12/21/2013    Priority: High   CKD (chronic kidney disease), stage III ([place]) 11/16/2011    Priority: Medium   Essential hypertension 01/25/2008    Priority: Medium   Hyperlipidemia 05/03/2007    Priority: Medium   Family history of breast cancer     Priority: Low   Allergic rhinitis 10/16/2014    Priority: Low   History of skin cancer- follows with skin surgery center yearly  08/23/2013    Priority: Low   Trigeminal neuralgia 01/19/2012    Priority: Low   NEPHROLITHIASIS, RECURRENT 04/30/2010    Priority: Low   Vitamin B12 deficiency neuropathy ([place]) 11/01/2008    Priority: Low    Medications- reviewed and updated Current Outpatient Medications  Medication Sig Dispense Refill   aspirin 81 MG tablet Take 81 mg by mouth daily.     atorvastatin (LIPITOR) 40 MG tablet Take 1 tablet (40 mg total) by mouth daily. 7 tablet 0   Cholecalciferol (VITAMIN D3) 2000 UNITS TABS Take 2,000 Units by mouth daily.     Cyanocobalamin (VITAMIN B-12 PO) Take by mouth.     lisinopril (ZESTRIL) 20 MG tablet Take 1 tablet (20 mg total) by mouth daily. 7 tablet 0   niacin 500 MG CR capsule Take 500 mg by mouth at bedtime.     No current  facility-administered medications for this visit.     Objective:  BP 125/78   Pulse (!) 58   Temp 98 F (36.7 C) (Temporal)   Ht 5' 7"  (1.702 m)   Wt 144 lb (65.3 kg)   SpO2 98%   BMI 22.55 kg/m  Gen: NAD, resting comfortably CV: RRR no murmurs rubs or gallops Lungs: CTAB no crackles, wheeze, rhonchi Abdomen: soft/nontender/nondistended/normal bowel sounds. No rebound or guarding.  Ext: no edema Skin: warm, dry     Assessment and Plan   #Hypertension S: Compliant with lisinopril 20 mg daily BP Readings from Last 3 Encounters:  05/04/21 125/78  11/03/20 130/82  04/29/20 122/74  A/P: Stable. Continue current medications.   #CKD stage III S: GFR been stable around 60. Blood pressure has been controlled, lipids controlled. Knows to avoid NSAIDs. A/P: Hopefully stable-update CMP with labs today  #Hyperlipidemia S: Compliant with atorvastatin 40 mg daily and niacin 500 mg before bedtime . He also preferred to continue aspirin 81 mg daily for primary prevention.  Lab Results  Component Value Date   CHOL 152 11/03/2020   HDL 46.00 11/03/2020   LDLCALC 79 11/03/2020   LDLDIRECT 65.3 01/29/2010   TRIG 133.0 11/03/2020   CHOLHDL 3 11/03/2020   A/P: Last lipids slightly above ideal goal for bad cholesterol of 70 or less at 80-at his age and without  indication for secondary prevention we opted to continue current medicine  #Vitamin B12 deficiency/neuropathy S: Compliant with folbic in past but no longer covered by insurance. Neuropathy largely resolved on B complex- Had rare tingling into both legs- There was no reoccured issues. Recently has just be on around 600 mg of vitamin b12 daily he reports - also on vitamin d3 2000 units daily Lab Results  Component Value Date   VITAMINB12 212 11/03/2020  A/P: hopefully improved- update b12 today. Continue current meds for now   #Elevated PSA-PSA were stable from 2020 to 2021 year over a year though have increased previously and  come back down June 2020-patient was preferred to do another PSA check be on the safe side due to underlying genetic mutation . We checked and trended back down- will now discontinue checks Lab Results  Component Value Date   PSA 1.97 11/03/2020   PSA 1.6 04/29/2020   PSA 2.39 10/31/2019   # HM- will consider flu shot in fall-= usually October. Wants to wait on covid 19 specific vaccine  Recommended follow up: keep physical in february Future Appointments  Date Time Provider [place]  11/04/2021  9:20 AM Marin Olp, MD [facility]   Lab/Order associations:   ICD-10-CM   1. Hyperlipidemia, unspecified hyperlipidemia type  E78.5     2. Vitamin B12 deficiency neuropathy (HCC)  E53.8    G63     3. Stage 3 chronic kidney disease, unspecified whether stage 3a or 3b CKD (HCC)  N18.30     4. Essential hypertension  I10       No orders of the defined types were placed in this encounter.   I,Jada Bradford,acting as a scribe for Garret Reddish, MD.,have documented all relevant documentation on the behalf of Garret Reddish, MD,as directed by  Garret Reddish, MD while in the presence of Garret Reddish, MD.  I, Garret Reddish, MD, have reviewed all documentation for this visit. The documentation on 05/04/21 for the exam, diagnosis, procedures, and orders are all accurate and complete. Return precautions advised.  Garret Reddish, MD

## 2021-05-04 ENCOUNTER — Ambulatory Visit (INDEPENDENT_AMBULATORY_CARE_PROVIDER_SITE_OTHER): Payer: Medicare Other | Admitting: Family Medicine

## 2021-05-04 ENCOUNTER — Other Ambulatory Visit: Payer: Self-pay

## 2021-05-04 ENCOUNTER — Encounter: Payer: Self-pay | Admitting: Family Medicine

## 2021-05-04 VITALS — BP 125/78 | HR 58 | Temp 98.0°F | Ht 67.0 in | Wt 144.0 lb

## 2021-05-04 DIAGNOSIS — I10 Essential (primary) hypertension: Secondary | ICD-10-CM | POA: Diagnosis not present

## 2021-05-04 DIAGNOSIS — G63 Polyneuropathy in diseases classified elsewhere: Secondary | ICD-10-CM

## 2021-05-04 DIAGNOSIS — N183 Chronic kidney disease, stage 3 unspecified: Secondary | ICD-10-CM

## 2021-05-04 DIAGNOSIS — E538 Deficiency of other specified B group vitamins: Secondary | ICD-10-CM

## 2021-05-04 DIAGNOSIS — E785 Hyperlipidemia, unspecified: Secondary | ICD-10-CM | POA: Diagnosis not present

## 2021-05-04 LAB — COMPREHENSIVE METABOLIC PANEL
ALT: 15 U/L (ref 0–53)
AST: 16 U/L (ref 0–37)
Albumin: 4.3 g/dL (ref 3.5–5.2)
Alkaline Phosphatase: 83 U/L (ref 39–117)
BUN: 24 mg/dL — ABNORMAL HIGH (ref 6–23)
CO2: 27 mEq/L (ref 19–32)
Calcium: 9.9 mg/dL (ref 8.4–10.5)
Chloride: 105 mEq/L (ref 96–112)
Creatinine, Ser: 1.35 mg/dL (ref 0.40–1.50)
GFR: 51.84 mL/min — ABNORMAL LOW (ref >60.00)
Glucose, Bld: 99 mg/dL (ref 70–99)
Potassium: 5.2 mEq/L — ABNORMAL HIGH (ref 3.5–5.1)
Sodium: 140 mEq/L (ref 135–145)
Total Bilirubin: 1.3 mg/dL — ABNORMAL HIGH (ref 0.2–1.2)
Total Protein: 6.6 g/dL (ref 6.0–8.3)

## 2021-05-04 LAB — VITAMIN B12: Vitamin B-12: 754 pg/mL (ref 211–911)

## 2021-05-04 NOTE — Patient Instructions (Addendum)
Health Maintenance Due  Topic Date Due   COVID-19 Vaccine (4 - Booster for [employer] series)   - Please consider getting the new various COVID shot in the Fall.  12/11/2020   INFLUENZA VACCINE   - Please consider getting your flu shot in the Fall. If you get this outside of our office, please let us know.  04/20/2021   Please stop by lab before you go If you have mychart- we will send your results within 3 business days of [place] receiving them.  If you do not have mychart- we will call you about results within 5 business days of [place] receiving them.  *please also note that you will see labs on mychart as soon as they post. I will later go in and write notes on them- will say "notes from Dr. Yong Channel"  Recommended follow up: keep physical in february

## 2021-06-04 ENCOUNTER — Ambulatory Visit (INDEPENDENT_AMBULATORY_CARE_PROVIDER_SITE_OTHER): Payer: Medicare Other

## 2021-06-04 DIAGNOSIS — Z23 Encounter for immunization: Secondary | ICD-10-CM | POA: Diagnosis not present

## 2021-06-04 NOTE — Progress Notes (Signed)
Patient presented in the office today for his flu vaccine. I administered a high dose flu vaccine into his Right Deltoid muscle. The patient tolerated the vaccine well and his injection site looked very well upon me finishing the injection.

## 2021-07-09 DIAGNOSIS — L821 Other seborrheic keratosis: Secondary | ICD-10-CM | POA: Diagnosis not present

## 2021-07-09 DIAGNOSIS — D225 Melanocytic nevi of trunk: Secondary | ICD-10-CM | POA: Diagnosis not present

## 2021-07-09 DIAGNOSIS — L814 Other melanin hyperpigmentation: Secondary | ICD-10-CM | POA: Diagnosis not present

## 2021-07-09 DIAGNOSIS — D492 Neoplasm of unspecified behavior of bone, soft tissue, and skin: Secondary | ICD-10-CM | POA: Diagnosis not present

## 2021-07-14 DIAGNOSIS — H0012 Chalazion right lower eyelid: Secondary | ICD-10-CM | POA: Diagnosis not present

## 2021-07-22 ENCOUNTER — Encounter: Payer: Self-pay | Admitting: Family Medicine

## 2021-07-23 ENCOUNTER — Encounter (HOSPITAL_BASED_OUTPATIENT_CLINIC_OR_DEPARTMENT_OTHER): Payer: Self-pay | Admitting: *Deleted

## 2021-07-23 ENCOUNTER — Emergency Department (HOSPITAL_BASED_OUTPATIENT_CLINIC_OR_DEPARTMENT_OTHER): Payer: Medicare Other

## 2021-07-23 ENCOUNTER — Emergency Department (HOSPITAL_BASED_OUTPATIENT_CLINIC_OR_DEPARTMENT_OTHER)
Admission: EM | Admit: 2021-07-23 | Discharge: 2021-07-23 | Disposition: A | Discharge status: Home / Self Care | Payer: Medicare Other | Attending: Emergency Medicine | Admitting: Emergency Medicine

## 2021-07-23 ENCOUNTER — Telehealth: Payer: Self-pay

## 2021-07-23 ENCOUNTER — Other Ambulatory Visit: Payer: Self-pay

## 2021-07-23 DIAGNOSIS — Z79899 Other long term (current) drug therapy: Secondary | ICD-10-CM | POA: Insufficient documentation

## 2021-07-23 DIAGNOSIS — N50812 Left testicular pain: Secondary | ICD-10-CM | POA: Diagnosis not present

## 2021-07-23 DIAGNOSIS — N433 Hydrocele, unspecified: Secondary | ICD-10-CM | POA: Diagnosis not present

## 2021-07-23 DIAGNOSIS — I129 Hypertensive chronic kidney disease with stage 1 through stage 4 chronic kidney disease, or unspecified chronic kidney disease: Secondary | ICD-10-CM | POA: Insufficient documentation

## 2021-07-23 DIAGNOSIS — N183 Chronic kidney disease, stage 3 unspecified: Secondary | ICD-10-CM | POA: Diagnosis not present

## 2021-07-23 DIAGNOSIS — R109 Unspecified abdominal pain: Secondary | ICD-10-CM | POA: Diagnosis not present

## 2021-07-23 DIAGNOSIS — R112 Nausea with vomiting, unspecified: Secondary | ICD-10-CM | POA: Insufficient documentation

## 2021-07-23 DIAGNOSIS — R1110 Vomiting, unspecified: Secondary | ICD-10-CM | POA: Diagnosis not present

## 2021-07-23 DIAGNOSIS — Z7982 Long term (current) use of aspirin: Secondary | ICD-10-CM | POA: Insufficient documentation

## 2021-07-23 DIAGNOSIS — N503 Cyst of epididymis: Secondary | ICD-10-CM | POA: Diagnosis not present

## 2021-07-23 DIAGNOSIS — R1030 Lower abdominal pain, unspecified: Secondary | ICD-10-CM

## 2021-07-23 DIAGNOSIS — R1032 Left lower quadrant pain: Secondary | ICD-10-CM | POA: Diagnosis present

## 2021-07-23 LAB — COMPREHENSIVE METABOLIC PANEL
ALT: 10 U/L (ref 0–44)
AST: 15 U/L (ref 15–41)
Albumin: 3.9 g/dL (ref 3.5–5.0)
Alkaline Phosphatase: 62 U/L (ref 38–126)
Anion gap: 7 (ref 5–15)
BUN: 19 mg/dL (ref 8–23)
CO2: 29 mmol/L (ref 22–32)
Calcium: 8.8 mg/dL — ABNORMAL LOW (ref 8.9–10.3)
Chloride: 105 mmol/L (ref 98–111)
Creatinine, Ser: 1.22 mg/dL (ref 0.61–1.24)
GFR, Estimated: >60 mL/min (ref >60)
Glucose, Bld: 87 mg/dL (ref 70–99)
Potassium: 4.3 mmol/L (ref 3.5–5.1)
Sodium: 141 mmol/L (ref 135–145)
Total Bilirubin: 1.3 mg/dL — ABNORMAL HIGH (ref 0.3–1.2)
Total Protein: 6.4 g/dL — ABNORMAL LOW (ref 6.5–8.1)

## 2021-07-23 LAB — CBC
HCT: 42 % (ref 39.0–52.0)
Hemoglobin: 13.4 g/dL (ref 13.0–17.0)
MCH: 28.8 pg (ref 26.0–34.0)
MCHC: 31.9 g/dL (ref 30.0–36.0)
MCV: 90.3 fL (ref 80.0–100.0)
Platelets: 186 10*3/uL (ref 150–400)
RBC: 4.65 MIL/uL (ref 4.22–5.81)
RDW: 13 % (ref 11.5–15.5)
WBC: 8.1 10*3/uL (ref 4.0–10.5)
nRBC: 0 % (ref 0.0–0.2)

## 2021-07-23 LAB — URINALYSIS, ROUTINE W REFLEX MICROSCOPIC
Bilirubin Urine: NEGATIVE
Glucose, UA: NEGATIVE mg/dL
Hgb urine dipstick: NEGATIVE
Ketones, ur: NEGATIVE mg/dL
Leukocytes,Ua: NEGATIVE
Nitrite: NEGATIVE
Protein, ur: NEGATIVE mg/dL
Specific Gravity, Urine: 1.024 (ref 1.005–1.030)
pH: 5.5 (ref 5.0–8.0)

## 2021-07-23 LAB — LIPASE, BLOOD: Lipase: 44 U/L (ref 11–51)

## 2021-07-23 MED ORDER — IOHEXOL 300 MG/ML  SOLN
100.0000 mL | Freq: Once | INTRAMUSCULAR | Status: AC | PRN
  Administered 2021-07-23: 100 mL via INTRAVENOUS

## 2021-07-23 MED ORDER — OMEPRAZOLE 20 MG PO CPDR: 20.0000 mg | DELAYED_RELEASE_CAPSULE | Freq: Every day | ORAL | 0 refills | Status: DC

## 2021-07-23 NOTE — ED Triage Notes (Signed)
Pt is here for abdominal pain that began 2 days ago.  Pt had nausea and vomiting Tuesday and noted that after vomiting 6 times on Tuesday he has had left testicular pain and swelling.  Pt also has not had an appetite since Tuesday.  His PCP wanted him to come here for evaluation.

## 2021-07-23 NOTE — Discharge Instructions (Signed)
You were seen in the emergency department for lower abdominal pain and swelling and pain in your left testicle.  You had lab work urinalysis scrotal ultrasound and a CAT scan of your abdomen and pelvis.  The main acute finding was a hydrocele in your scrotum.  Urology is recommending anti-inflammatories such as ibuprofen or Naprosyn and scrotal elevation.  We are also putting you on an acid medication for your stomach as this may be a cause of your stomach pain.  Please contact your primary care doctor and urology for follow-up.  Return to the emergency department if any worsening or concerning symptoms

## 2021-07-23 NOTE — Telephone Encounter (Signed)
Nurse Assessment ---Spoke with patient's wife because he is not able to hear on the phone. She states 4 weeks ago, her husband had abdominal pain and felt like something was stuck in his chest. He was having trouble having a BM and vomited x 1. He was straining alot but felt some better after vomiting. He had swelling in his testicles. The swelling went down, but came back on Tuesday night. He is still having some soreness in places in his abdomen. He vomited once in the middle of night 2 nights ago Does the patient have any new or worsening   COMMENTS: Caller states she is a Marine scientist. She does not feel he has a stranguatled hernia or testicular torsion because he would be in more pain. She feels he is stable right now. She does not trust the health care where they live. They really want to see Dr. Yong Channel to get his advice. She declined a telehealth visit due to patient's decreased hearing. She declined going to another office. They have a 3-4 hrs drive to get to the office and will not be able to make an appt today. She is wanting an appt for tomorrow. RN called back line and they will get message to Dr. Ansel Bong nurse and contact caller.   symptoms? ---Yes Will a triage be completed? ---Yes Related visit to physician within the last 2 weeks? ---No Does the PT have any chronic conditions? (i.e. diabetes, asthma, this includes High risk factors for pregnancy, etc.) ---Yes List chronic conditions. ---HTN, High Cholesterol Is this a behavioral health or substance abuse call? ---No  Scrotum Swelling Scrotum is painful or tender to touch Ronnald Ramp, RN, [place] 07/23/2021 9:37:18 AM Disp. Time Eilene Ghazi Time) Disposition Final User 07/23/2021 9:49:15 AM Go to ED Now Yes Ronnald Ramp, RN, [place] Caller Disagree/Comply Disagree Caller Understands Yes PreDisposition Call Doctor Care Advice Given Per Guideline GO TO ED NOW: * You need to be seen in the Emergency Department. * Go to the ED at ___________ [place] now. Drive carefully. CARE ADVICE given per Scrotum Swelling (Adult) guideline.

## 2021-07-23 NOTE — ED Notes (Signed)
Back from ultrasound

## 2021-07-23 NOTE — ED Notes (Signed)
Patient transported to Ultrasound 

## 2021-07-23 NOTE — Telephone Encounter (Signed)
After speaking with Dr.Hunter who recommended that patient go to ED. I called wife and spoke with her and Bryan Carson, advised that it would be best to go to ED. Baker Janus said that she would talk with Jeneen Rinks about going to the [place] location.

## 2021-07-23 NOTE — ED Provider Notes (Signed)
[place] EMERGENCY DEPT Provider Note   CSN: [id number] Arrival date & time: 07/23/21  1600     History Chief Complaint  Patient presents with   Abdominal Pain    Bryan Carson is a 75 y.o. male.  He is here for evaluation of abdominal pain and left testicular pain and swelling.  Michela Pitcher it started about 4 days ago in his upper abdomen and then had 1 bout of 4 episodes of vomiting.  The pain then kind of migrated down lower and has been there since then.  No change in his bowels or urination.  No radiation into the back.  Has tried nothing for it.  He had a similar pain about 3 weeks ago that resolved on its own after about a week.  Denies any fevers chills cough chest pain shortness of breath.  Has more longstanding difficulty swallowing food at times and has to vomit.  The history is provided by the patient.  Abdominal Pain Pain location:  LLQ, RLQ and suprapubic Pain quality: aching   Pain radiates to:  Does not radiate Pain severity:  Moderate Onset quality:  Gradual Duration:  4 days Timing:  Constant Progression:  Unchanged Chronicity:  Recurrent Context: not trauma   Relieved by:  Lying down Worsened by:  Movement Ineffective treatments:  None tried Associated symptoms: nausea and vomiting   Associated symptoms: no chest pain, no constipation, no cough, no diarrhea, no dysuria, no fever, no hematuria, no melena, no shortness of breath and no sore throat       Past Medical History:  Diagnosis Date   B12 deficiency anemia    Diverticulosis    Family history of breast cancer    Hearing loss in left ear    essentially nonfunctioning cochlear implant   Hearing loss of right ear    Hyperlipidemia    Hypertension     Patient Active Problem List   Diagnosis Date Noted   Monoallelic mutation of PALB2 gene 09/26/2015   Family history of breast cancer    Allergic rhinitis 10/16/2014   Cochlear implant in place 12/21/2013   History of skin cancer-  follows with skin surgery center yearly  08/23/2013   Trigeminal neuralgia 01/19/2012   CKD (chronic kidney disease), stage III ([place]) 11/16/2011   NEPHROLITHIASIS, RECURRENT 04/30/2010   Vitamin B12 deficiency neuropathy ([place]) 11/01/2008   Essential hypertension 01/25/2008   Hyperlipidemia 05/03/2007    Past Surgical History:  Procedure Laterality Date   COCHLEAR IMPLANT     left   COLONOSCOPY     KIDNEY STONE SURGERY     nasal polyp removal bilateral     skin cancer removal     2010-dermatology (sees derm regularly)       Family History  Problem Relation Age of Onset   Breast cancer Mother        dx in her 13s   Diabetes Mother    Hypertension Father    Heart disease Father        lieftime smoker   COPD Father    Cancer Maternal Aunt        1 maternal aunt with cancer NOS in her late 60s   Cancer Maternal Uncle        2 maternal uncles with cancer NOS   Cancer Maternal Grandfather        cancer NOS   Hodgkin's lymphoma Cousin     Social History   Tobacco Use   Smoking status: Never  Smokeless tobacco: Never  Substance Use Topics   Alcohol use: Yes    Alcohol/week: 0.0 standard drinks    Comment: 1 a month   Drug use: No    Home Medications Prior to Admission medications   Medication Sig Start Date End Date Taking? Authorizing Provider  aspirin 81 MG tablet Take 81 mg by mouth daily.    [provider]  atorvastatin (LIPITOR) 40 MG tablet Take 1 tablet (40 mg total) by mouth daily. 12/24/20   Marin Olp, MD  Cholecalciferol (VITAMIN D3) 2000 UNITS TABS Take 2,000 Units by mouth daily.    [provider]  Cyanocobalamin (VITAMIN B-12 PO) Take by mouth.    [provider]  lisinopril (ZESTRIL) 20 MG tablet Take 1 tablet (20 mg total) by mouth daily. 12/24/20   Marin Olp, MD  niacin 500 MG CR capsule Take 500 mg by mouth at bedtime.    [provider]    Allergies    Patient has no known allergies.  Review  of Systems   Review of Systems  Constitutional:  Negative for fever.  HENT:  Negative for sore throat.   Eyes:  Negative for visual disturbance.  Respiratory:  Negative for cough and shortness of breath.   Cardiovascular:  Negative for chest pain.  Gastrointestinal:  Positive for abdominal pain, nausea and vomiting. Negative for constipation, diarrhea and melena.  Genitourinary:  Positive for scrotal swelling and testicular pain. Negative for dysuria and hematuria.  Musculoskeletal:  Negative for back pain.  Skin:  Negative for rash.  Neurological:  Negative for headaches.   Physical Exam Updated Vital Signs BP (!) 144/85 (BP Location: Right Arm)   Pulse 63   Temp 97.8 F (36.6 C) (Oral)   Resp 14   SpO2 99%   Physical Exam Vitals and nursing note reviewed.  Constitutional:      Appearance: He is well-developed.  HENT:     Head: Normocephalic and atraumatic.  Eyes:     Conjunctiva/sclera: Conjunctivae normal.  Cardiovascular:     Rate and Rhythm: Normal rate and regular rhythm.     Heart sounds: No murmur heard. Pulmonary:     Effort: Pulmonary effort is normal. No respiratory distress.     Breath sounds: Normal breath sounds.  Abdominal:     Palpations: Abdomen is soft.     Tenderness: There is abdominal tenderness in the right lower quadrant, suprapubic area and left lower quadrant. There is no guarding or rebound.     Hernia: No hernia is present.  Genitourinary:    Testes:        Right: Tenderness not present.        Left: Tenderness and swelling present. Mass not present.  Musculoskeletal:     Cervical back: Neck supple.  Skin:    General: Skin is warm and dry.     Capillary Refill: Capillary refill takes less than 2 seconds.  Neurological:     General: No focal deficit present.     Mental Status: He is alert.    ED Results / Procedures / Treatments   Labs (all labs ordered are listed, but only abnormal results are displayed) Labs Reviewed  COMPREHENSIVE  METABOLIC PANEL - Abnormal; Notable for the following components:      Result Value   Calcium 8.8 (*)    Total Protein 6.4 (*)    Total Bilirubin 1.3 (*)    All other components within normal limits  LIPASE, BLOOD  CBC  URINALYSIS, ROUTINE W REFLEX MICROSCOPIC    EKG None  Radiology CT Abdomen Pelvis W Contrast  Result Date: 07/23/2021 CLINICAL DATA:  Abdominal pain, nausea, vomiting EXAM: CT ABDOMEN AND PELVIS WITH CONTRAST TECHNIQUE: Multidetector CT imaging of the abdomen and pelvis was performed using the standard protocol following bolus administration of intravenous contrast. CONTRAST:  144mL OMNIPAQUE IOHEXOL 300 MG/ML  SOLN COMPARISON:  10/18/2013 FINDINGS: Lower chest: Lung bases are clear. No effusions. Heart is normal size. Hepatobiliary: 2.5 cm cyst in the right hepatic lobe. 12 mm cyst posteriorly in the right hepatic lobe. Subcentimeter cyst in the right hepatic inferior tip. No suspicious focal hepatic abnormality. Gallbladder unremarkable. Pancreas: No focal abnormality or ductal dilatation. Spleen: No focal abnormality.  Normal size. Adrenals/Urinary Tract: Left parapelvic cysts. No hydronephrosis. Adrenal glands and urinary bladder unremarkable. Stomach/Bowel: Extensive sigmoid diverticulosis. No active diverticulitis. Appendix is normal. No bowel obstruction. Vascular/Lymphatic: Aortic atherosclerosis. No evidence of aneurysm or adenopathy. Reproductive: Prominent prostate Other: No free fluid or free air. Musculoskeletal: No acute bony abnormality. IMPRESSION: No acute findings. Sigmoid diverticulosis.  No active diverticulitis. Hepatic and renal cysts. Aortic atherosclerosis. Prostate enlargement. Electronically Signed   By: Rolm Baptise M.D.   On: 07/23/2021 18:54   US SCROTUM W/DOPPLER  Result Date: 07/23/2021 CLINICAL DATA:  Left-sided scrotal swelling and pain. EXAM: SCROTAL ULTRASOUND DOPPLER ULTRASOUND OF THE TESTICLES TECHNIQUE: Complete ultrasound examination of the  testicles, epididymis, and other scrotal structures was performed. Color and spectral Doppler ultrasound were also utilized to evaluate blood flow to the testicles. COMPARISON:  None. FINDINGS: Right testicle Measurements: 3.3 cm x 1.6 cm x 2.1 cm. No mass or microlithiasis visualized. Left testicle Measurements: 3.3 cm x 2.3 cm x 2.2 cm. No mass or microlithiasis visualized. Right epididymis:  Normal in size and appearance. Left epididymis: A 0.7 cm x 0.7 cm x 1.4 cm left epididymal head cyst is seen. Hydrocele:  A large left-sided hydrocele is noted. Varicocele:  None visualized. Pulsed Doppler interrogation of both testes demonstrates normal low resistance arterial and venous waveforms bilaterally. IMPRESSION: 1. Large left-sided hydrocele. 2. Small left epididymal head cyst. 3. Small bilateral testicular flow. Electronically Signed   By: Virgina Norfolk M.D.   On: 07/23/2021 17:27    Procedures Procedures   Medications Ordered in ED Medications  iohexol (OMNIPAQUE) 300 MG/ML solution 100 mL (100 mLs Intravenous Contrast Given 07/23/21 1840)    ED Course  I have reviewed the triage vital signs and the nursing notes.  Pertinent labs & imaging results that were available during my care of the patient were reviewed by me and considered in my medical decision making (see chart for details).  Clinical Course as of 07/24/21 1031  Thu Jul 23, 2021  1921 Discussed with Dr. Milford Cage from urology.  He felt that patient would benefit from anti-inflammatories and scrotal support and can follow-up outpatient with them in the office. [MB]  1922 Reviewed results with patient and his wife.  Recommended close follow-up with urology and PCP.  Return instructions discussed [MB]    Clinical Course User Index [MB] Hayden Rasmussen, MD   MDM Rules/Calculators/A&P                          This patient complains of abdominal pain and vomiting left testicular pain and swelling; this involves an extensive number  of treatment Options and is a complaint that carries with it a high risk of complications and  Morbidity. The differential includes renal colic, testicular torsion, diverticulitis, hernia, appendicitis, cholecystitis  I ordered, reviewed and interpreted labs, which included CBC with normal white count normal hemoglobin, chemistries normal, LFTs fairly normal, urinalysis negative I ordered imaging studies which included scrotal ultrasound and CT abdomen and pelvis and I independently    visualized and interpreted imaging which showed left hydrocele, no evidence of torsion, no acute intra-abdominal findings Additional history obtained from patient's wife Previous records obtained and reviewed in epic no recent admissions I consulted Dr. Milford [facility] and discussed lab and imaging findings  Critical Interventions: None  After the interventions stated above, I reevaluated the patient and found patient to have a benign abdominal exam.  Reviewed results of work-up with him and his wife.  Recommended close follow-up with urology and his primary care doctor.  We will put on PPI for possible gastritis.  Return instructions discussed   Final Clinical Impression(s) / ED Diagnoses Final diagnoses:  Lower abdominal pain  Hydrocele in adult    Rx / DC Orders ED Discharge Orders          Ordered    omeprazole (PRILOSEC) 20 MG capsule  Daily,   Status:  Discontinued        07/23/21 1928    omeprazole (PRILOSEC) 20 MG capsule  Daily        07/23/21 1948             Hayden Rasmussen, MD 07/24/21 1034

## 2021-07-23 NOTE — Telephone Encounter (Signed)
Error

## 2021-07-24 NOTE — Telephone Encounter (Signed)
Per chart review, patient was seen in ED yesterday.

## 2021-07-27 DIAGNOSIS — K4091 Unilateral inguinal hernia, without obstruction or gangrene, recurrent: Secondary | ICD-10-CM | POA: Diagnosis not present

## 2021-07-28 ENCOUNTER — Encounter: Payer: Self-pay | Admitting: Family Medicine

## 2021-07-28 ENCOUNTER — Other Ambulatory Visit: Payer: Self-pay

## 2021-07-28 ENCOUNTER — Ambulatory Visit (INDEPENDENT_AMBULATORY_CARE_PROVIDER_SITE_OTHER): Payer: Medicare Other | Admitting: Family Medicine

## 2021-07-28 VITALS — BP 138/76 | HR 63 | Temp 97.5°F | Ht 67.0 in | Wt 144.0 lb

## 2021-07-28 DIAGNOSIS — R1032 Left lower quadrant pain: Secondary | ICD-10-CM | POA: Diagnosis not present

## 2021-07-28 DIAGNOSIS — G63 Polyneuropathy in diseases classified elsewhere: Secondary | ICD-10-CM

## 2021-07-28 DIAGNOSIS — E785 Hyperlipidemia, unspecified: Secondary | ICD-10-CM | POA: Diagnosis not present

## 2021-07-28 DIAGNOSIS — I10 Essential (primary) hypertension: Secondary | ICD-10-CM | POA: Diagnosis not present

## 2021-07-28 DIAGNOSIS — E538 Deficiency of other specified B group vitamins: Secondary | ICD-10-CM

## 2021-07-28 DIAGNOSIS — N183 Chronic kidney disease, stage 3 unspecified: Secondary | ICD-10-CM | POA: Diagnosis not present

## 2021-07-28 DIAGNOSIS — I700 Atherosclerosis of aorta: Secondary | ICD-10-CM | POA: Insufficient documentation

## 2021-07-28 MED ORDER — LISINOPRIL 20 MG PO TABS: 20.0000 mg | ORAL_TABLET | Freq: Every day | ORAL | 3 refills | Status: DC

## 2021-07-28 MED ORDER — ATORVASTATIN CALCIUM 40 MG PO TABS: 40.0000 mg | ORAL_TABLET | Freq: Every day | ORAL | 3 refills | Status: DC

## 2021-07-28 NOTE — Progress Notes (Signed)
Phone [phone] In person visit   Subjective:   Bryan Carson is a 75 y.o. year old very pleasant male patient who presents for/with See problem oriented charting Chief Complaint  Patient presents with   Follow-up    ER f/u lower abd pain     This visit occurred during the SARS-CoV-2 public health emergency.  Safety protocols were in place, including screening questions prior to the visit, additional usage of staff PPE, and extensive cleaning of exam room while observing appropriate contact time as indicated for disinfecting solutions.   Past Medical History-  Patient Active Problem List   Diagnosis Date Noted   Monoallelic mutation of PALB2 gene 09/26/2015    Priority: High   Cochlear implant in place 12/21/2013    Priority: High   Aortic atherosclerosis ([place]) 07/28/2021    Priority: Medium    CKD (chronic kidney disease), stage III ([place]) 11/16/2011    Priority: Medium    Essential hypertension 01/25/2008    Priority: Medium    Hyperlipidemia 05/03/2007    Priority: Medium    Family history of breast cancer     Priority: Low   Allergic rhinitis 10/16/2014    Priority: Low   History of skin cancer- follows with skin surgery center yearly  08/23/2013    Priority: Low   Trigeminal neuralgia 01/19/2012    Priority: Low   NEPHROLITHIASIS, RECURRENT 04/30/2010    Priority: Low   Vitamin B12 deficiency neuropathy ([place]) 11/01/2008    Priority: Low    Medications- reviewed and updated Current Outpatient Medications  Medication Sig Dispense Refill   aspirin 81 MG tablet Take 81 mg by mouth daily.     Cholecalciferol (VITAMIN D3) 2000 UNITS TABS Take 2,000 Units by mouth daily.     Cyanocobalamin (VITAMIN B-12 PO) Take by mouth.     niacin 500 MG CR capsule Take 500 mg by mouth at bedtime.     omeprazole (PRILOSEC) 20 MG capsule Take 1 capsule (20 mg total) by mouth daily. 30 capsule 0   atorvastatin (LIPITOR) 40 MG tablet Take 1 tablet (40 mg total) by mouth daily. 90  tablet 3   lisinopril (ZESTRIL) 20 MG tablet Take 1 tablet (20 mg total) by mouth daily. 90 tablet 3   No current facility-administered medications for this visit.     Objective:  BP 138/76   Pulse 63   Temp (!) 97.5 F (36.4 C)   Ht 5' 7"  (1.702 m)   Wt 144 lb (65.3 kg)   SpO2 96%   BMI 22.55 kg/m  Gen: NAD, resting comfortably CV: RRR no murmurs rubs or gallops Lungs: CTAB no crackles, wheeze, rhonchi Abdomen: soft/nontender/nondistended/normal bowel sounds. No rebound or guarding.  Ext: no edema Skin: warm, dry Neuro: Hard of hearing GU: Uncircumcised.  Unable to detect hernia or obvious hydrocele today   EKG: sinus bradycardia with rate 58, normal axis, normal intervals, no hypertrophy, no st or t wave changes. Compared to EKG from 09/29/12- bradycardic but no PACs noted as they were in 2014.      Assessment and Plan   # ED F/U for Abdominal Pain S:Patient presented to the ED on 07/23/2021 with a chief complaint of abdominal pain and left testicular pain and swelling. Onset started about 4 days prior - began in upper abdomen and had 1 out of 4 episodes of vomiting. Pain then gradually migrated lower and had been persistent at that location. He had a similar pain about 4 weeks prior  that resolved on its own after about a week.  A CT of the Abdomen/Pelvis was ordered during the encounter and found no acute findings. Sigmoid diverticulosis was noted and there was no active diverticulitis. 2.5 cm cyst in the right hepatic lobe , 12 mm cyst posteriorly in the right hepatic lobe, and sub-centimeter cyst in the right hepatic inferior tip.  No further work-up was recommended aortic atherosclerosis was also noted.   An ultrasound of the scrotum w/ Doppler was also ordered and found a large left-sided hydrocele , a small left epididymal head cyst, and a small bilateral testicular flow.   Labs were also ordered which included CBC with normal white count normal hemoglobin, chemistries  normal, LFTs fairly normal, urinalysis negative  Clinical course was discussed with Dr. Milford Cage from urology.  He felt that patient would benefit from anti-inflammatories and scrotal support and could follow-up outpatient with them in the office.   After interventions, Dr. Melina Copa reevaluated the patient and found that he had a benign abdominal exam. He reviewed results of work-up with him and his wife and recommended close follow-up with urology and patient's  primary care doctor. Patient was placed on PPI- omeprazole 20 mg daily for possible gastritis as well.    Today patient reports  saw urology yesterday - Dr. Lovena Neighbours. Dr. Lovena Neighbours did an exam and did not think hydrocele was present. Dr. Lovena Neighbours thinks this was actually a hernia that has reduced and referred to general surgeon. No pain on left side since getting out of ER- had some pain on right side but states exam was aggressive. Also had some bilaterally lower abdominal pain/bloating rather mild.   Had been taken some ibuprofen (we had advised against this in the past)- had been told to consider this due to hydrocele and inflammation- they will stop now since diagnosis looks more like hernia. Has been taking prilosec x 3 doses with no more upper abdominal pain- they bought OTC and plan for just 2 weeks.  -admits to some very heavy lifting at home recently A/P: 2 potential pain issues- gastritis (improving on short course of prilosec- and should avoid nsaids) and originally for Left  groin pain thought to be hydrocele but has seen urology and they are concerned for hernia and pending surgery consult (had been doing some really heavy lifting and certainly could have triggered) -I do not detect a hernia on exam today but I certainly respect Dr. Jackson Latino opinion and agree with getting general surgery opinion  *If he does need surgery- able to easily complete 4 mets without chest pain or shortness of breath.  Will check EKG with last being in 2014 though  not technically required prior to surgery  #Hypertension S: medication: Compliant with lisinopril 20 mg daily BP Readings from Last 3 Encounters:  07/28/21 138/76  07/23/21 132/77  05/04/21 125/78  A/P: Controlled. Continue current medications.    #CKD stage III S: GFR has been stable around 60-on most recent check in the ER was actually above 60. Blood pressure has been controlled, lipids controlled. Knows to avoid NSAIDs in general-short-term okay-monitor renal function on these. A/P: looked great on last check- offered repeat with recent nsaids but declines for now   #Hyperlipidemia #New diagnosis aortic atherosclerosis S: medication: Compliant with atorvastatin 40 mg daily. He also prefers to continue aspirin for primary prevention. also takes niacin Lab Results  Component Value Date   CHOL 152 11/03/2020   HDL 46.00 11/03/2020   LDLCALC 79 11/03/2020  LDLDIRECT 65.3 01/29/2010   TRIG 133.0 11/03/2020   CHOLHDL 3 11/03/2020  A/P: Patient with new diagnosis aortic atherosclerosis-discussed this is primarily a risk factor for heart attack or stroke-LDL goal would be 70 or less-on the other hand patient prefer not to increase potential risk of side effects such as diabetes and opted to maintain current dose even though LDL slightly elevated above goal-can recheck next year  Recommended follow up: Keep February physical Future Appointments  Date Time Provider [place]  11/04/2021  9:20 AM Marin Olp, MD [facility]   Lab/Order associations:   ICD-10-CM   1. Left groin pain  R10.32     2. Essential hypertension  I10 EKG 12-Lead    3. Stage 3 chronic kidney disease, unspecified whether stage 3a or 3b CKD (HCC)  N18.30     4. Hyperlipidemia, unspecified hyperlipidemia type  E78.5     5. Vitamin B12 deficiency neuropathy (HCC)  E53.8    G63     6. Aortic atherosclerosis (HCC)  I70.0      Meds ordered this encounter  Medications   atorvastatin (LIPITOR)  40 MG tablet    Sig: Take 1 tablet (40 mg total) by mouth daily.    Dispense:  90 tablet    Refill:  3   lisinopril (ZESTRIL) 20 MG tablet    Sig: Take 1 tablet (20 mg total) by mouth daily.    Dispense:  90 tablet    Refill:  3   I,Harris Phan,acting as a scribe for Garret Reddish, MD.,have documented all relevant documentation on the behalf of Garret Reddish, MD,as directed by  Garret Reddish, MD while in the presence of Garret Reddish, MD.   I, Garret Reddish, MD, have reviewed all documentation for this visit. The documentation on 07/28/21 for the exam, diagnosis, procedures, and orders are all accurate and complete.   Return precautions advised.  Garret Reddish, MD

## 2021-07-28 NOTE — Patient Instructions (Addendum)
Health Maintenance Due  Topic Date Due   COVID-19 Vaccine (4 - Booster for [employer] series) - Recommend getting Omicron/Bivalent booster only at your local pharmacy! Please let us know when you have received this vaccination.  10/08/2020   No labs today - your blood work looked reassuring in the hospital.  I recommend that you stop taking Ibuprofen due to your diagnosis looking more hernia related less this would be better for your kidneys  I think it is reasonable to continue taking your Prilosec for about 2 week and then you may come off of it.  Please let me know if you have recurrent upper abdominal pain  Team please order an EKG for patient today under hypertension.  Recommended follow up: Keep  your February physical or sooner if needed

## 2021-08-26 DIAGNOSIS — N5089 Other specified disorders of the male genital organs: Secondary | ICD-10-CM | POA: Diagnosis not present

## 2021-08-26 DIAGNOSIS — R1310 Dysphagia, unspecified: Secondary | ICD-10-CM | POA: Diagnosis not present

## 2021-10-14 ENCOUNTER — Encounter: Payer: Self-pay | Admitting: Family Medicine

## 2021-10-15 ENCOUNTER — Telehealth (INDEPENDENT_AMBULATORY_CARE_PROVIDER_SITE_OTHER): Payer: Medicare Other | Admitting: Family Medicine

## 2021-10-15 ENCOUNTER — Other Ambulatory Visit: Payer: Self-pay

## 2021-10-15 ENCOUNTER — Encounter: Payer: Self-pay | Admitting: Family Medicine

## 2021-10-15 DIAGNOSIS — B351 Tinea unguium: Secondary | ICD-10-CM | POA: Diagnosis not present

## 2021-10-15 MED ORDER — TERBINAFINE HCL 250 MG PO TABS: 250.0000 mg | ORAL_TABLET | Freq: Every day | ORAL | 0 refills | Status: DC

## 2021-10-15 NOTE — Telephone Encounter (Signed)
Pt scheduled for 4/20

## 2021-10-15 NOTE — Progress Notes (Signed)
°Phone [phone] °Virtual visit via Video note °  °Subjective:  °Chief complaint: °Chief Complaint  °Patient presents with  ° Nail Problem  ° °This visit type was conducted due to national recommendations for restrictions regarding the COVID-19 Pandemic (e.g. social distancing).  This format is felt to be most appropriate for this patient at this time balancing risks to patient and risks to population by having him in for in person visit.  No physical exam was performed (except for noted visual exam or audio findings with Telehealth visits).   ° °Our team/I connected with Bryan Carson at  4:20 PM EST by a video enabled telemedicine application (doxy.me or caregility through epic) and verified that I am speaking with the correct person using two identifiers.  °Location patient: Home-[place] °Location provider: [facility], office °Persons participating in the virtual visit:  patient ° °Our team/I discussed the limitations of evaluation and management by telemedicine and the availability of in person appointments. In light of current covid-19 pandemic, patient also understands that we are trying to protect them by minimizing in office contact if at all possible.  The patient expressed consent for telemedicine visit and agreed to proceed. Patient understands insurance will be billed.  ° °Past Medical History-  °Patient Active Problem List  ° Diagnosis Date Noted  ° Monoallelic mutation of PALB2 gene 09/26/2015  °  Priority: High  ° Cochlear implant in place 12/21/2013  °  Priority: High  ° Aortic atherosclerosis (HCC) 07/28/2021  °  Priority: Medium   ° CKD (chronic kidney disease), stage III (HCC) 11/16/2011  °  Priority: Medium   ° Essential hypertension 01/25/2008  °  Priority: Medium   ° Hyperlipidemia 05/03/2007  °  Priority: Medium   ° Family history of breast cancer   °  Priority: Low  ° Allergic rhinitis 10/16/2014  °  Priority: Low  ° History of skin cancer- follows with skin surgery center yearly  08/23/2013   °  Priority: Low  ° Trigeminal neuralgia 01/19/2012  °  Priority: Low  ° NEPHROLITHIASIS, RECURRENT 04/30/2010  °  Priority: Low  ° Vitamin B12 deficiency neuropathy (HCC) 11/01/2008  °  Priority: Low  ° ° °Medications- reviewed and updated °Current Outpatient Medications  °Medication Sig Dispense Refill  ° aspirin 81 MG tablet Take 81 mg by mouth daily.    ° atorvastatin (LIPITOR) 40 MG tablet Take 1 tablet (40 mg total) by mouth daily. 90 tablet 3  ° Cholecalciferol (VITAMIN D3) 2000 UNITS TABS Take 2,000 Units by mouth daily.    ° Cyanocobalamin (VITAMIN B-12 PO) Take by mouth.    ° lisinopril (ZESTRIL) 20 MG tablet Take 1 tablet (20 mg total) by mouth daily. 90 tablet 3  ° niacin 500 MG CR capsule Take 500 mg by mouth at bedtime.    ° omeprazole (PRILOSEC) 20 MG capsule Take 1 capsule (20 mg total) by mouth daily. (Patient not taking: Reported on 10/15/2021) 30 capsule 0  ° °No current facility-administered medications for this visit.  ° °  °Objective:  °No self reported vitals °Gen: NAD, resting comfortably °Lungs: nonlabored, normal respiratory rate  °Skin: appears dry, no obvious rash, patient able to pull left great toenail into view-thickened and yellow across the nail and approaches the proximal nailbed ° °  ° °Assessment and Plan  ° °# onychomycosis  °S:has noted thickening yellowed nails starting at least a year ago- maybe 2. Tried OTC Products without relief topically. Never seems to help.  Left   great toenail only one involved initially- now right big toe starting to have similar appearance °A/P: Patient with left great toenail likely onychomycosis.  We discussed differential of dystrophic toenail-the fact he has another toenail on the right started having similar appearance increases likelihood of onychomycosis.  He would like to trial terbinafine.  We discussed benefits/risks and went through listed side effects on [email or web address]-date.com as well as potential consequences of medicine.  We discussed importance  of rechecking LFTs in approximately 6 weeks-he agrees to have these drawn at his upcoming appointment in February ° °Recommended follow up: Keep already scheduled appointment °Future Appointments  °Date Time Provider Department Center  °11/16/2021 11:20 AM Hunter, Stephen O, MD [facility]  ° ° °Lab/Order associations: °  ICD-10-CM   °1. Onychomycosis  B35.1   °  ° ° °Meds ordered this encounter  °Medications  ° terbinafine (LAMISIL) 250 MG tablet  °  Sig: Take 1 tablet (250 mg total) by mouth daily. For toenail fungus/onychomycosis  °  Dispense:  90 tablet  °  Refill:  0  ° ° °Time Spent: °20 minutes of total time (5:05 PM-5:25 PM) was spent on the date of the encounter performing the following actions: chart review prior to seeing the patient, obtaining history, performing a medically necessary exam, counseling on the treatment plan, placing orders, and documenting in our EHR.  ° °Return precautions advised.  °Stephen Hunter, MD ° °

## 2021-11-04 ENCOUNTER — Encounter: Payer: Medicare Other | Admitting: Family Medicine

## 2021-11-16 ENCOUNTER — Encounter: Payer: Self-pay | Admitting: Family Medicine

## 2021-11-16 ENCOUNTER — Other Ambulatory Visit: Payer: Self-pay

## 2021-11-16 ENCOUNTER — Ambulatory Visit (INDEPENDENT_AMBULATORY_CARE_PROVIDER_SITE_OTHER): Payer: Medicare Other | Admitting: Family Medicine

## 2021-11-16 VITALS — BP 122/72 | HR 61 | Temp 98.7°F | Ht 67.0 in | Wt 144.6 lb

## 2021-11-16 DIAGNOSIS — R351 Nocturia: Secondary | ICD-10-CM | POA: Diagnosis not present

## 2021-11-16 DIAGNOSIS — E785 Hyperlipidemia, unspecified: Secondary | ICD-10-CM

## 2021-11-16 DIAGNOSIS — Z1509 Genetic susceptibility to other malignant neoplasm: Secondary | ICD-10-CM

## 2021-11-16 DIAGNOSIS — Z0000 Encounter for general adult medical examination without abnormal findings: Secondary | ICD-10-CM | POA: Diagnosis not present

## 2021-11-16 DIAGNOSIS — E538 Deficiency of other specified B group vitamins: Secondary | ICD-10-CM | POA: Diagnosis not present

## 2021-11-16 DIAGNOSIS — G63 Polyneuropathy in diseases classified elsewhere: Secondary | ICD-10-CM | POA: Diagnosis not present

## 2021-11-16 DIAGNOSIS — Z1589 Genetic susceptibility to other disease: Secondary | ICD-10-CM

## 2021-11-16 DIAGNOSIS — Z1501 Genetic susceptibility to malignant neoplasm of breast: Secondary | ICD-10-CM | POA: Diagnosis not present

## 2021-11-16 DIAGNOSIS — I10 Essential (primary) hypertension: Secondary | ICD-10-CM

## 2021-11-16 DIAGNOSIS — N183 Chronic kidney disease, stage 3 unspecified: Secondary | ICD-10-CM

## 2021-11-16 DIAGNOSIS — I700 Atherosclerosis of aorta: Secondary | ICD-10-CM

## 2021-11-16 LAB — COMPREHENSIVE METABOLIC PANEL
ALT: 16 U/L (ref 0–53)
AST: 16 U/L (ref 0–37)
Albumin: 4.5 g/dL (ref 3.5–5.2)
Alkaline Phosphatase: 87 U/L (ref 39–117)
BUN: 11 mg/dL (ref 6–23)
CO2: 30 mEq/L (ref 19–32)
Calcium: 10 mg/dL (ref 8.4–10.5)
Chloride: 106 mEq/L (ref 96–112)
Creatinine, Ser: 1.29 mg/dL (ref 0.40–1.50)
GFR: 54.54 mL/min — ABNORMAL LOW (ref >60.00)
Glucose, Bld: 91 mg/dL (ref 70–99)
Potassium: 4.8 mEq/L (ref 3.5–5.1)
Sodium: 140 mEq/L (ref 135–145)
Total Bilirubin: 0.9 mg/dL (ref 0.2–1.2)
Total Protein: 6.8 g/dL (ref 6.0–8.3)

## 2021-11-16 LAB — CBC WITH DIFFERENTIAL/PLATELET
Basophils Absolute: 0.1 10*3/uL (ref 0.0–0.1)
Basophils Relative: 0.9 % (ref 0.0–3.0)
Eosinophils Absolute: 0.4 10*3/uL (ref 0.0–0.7)
Eosinophils Relative: 6.2 % — ABNORMAL HIGH (ref 0.0–5.0)
HCT: 42.8 % (ref 39.0–52.0)
Hemoglobin: 14 g/dL (ref 13.0–17.0)
Lymphocytes Relative: 27.5 % (ref 12.0–46.0)
Lymphs Abs: 1.9 10*3/uL (ref 0.7–4.0)
MCHC: 32.8 g/dL (ref 30.0–36.0)
MCV: 87.4 fl (ref 78.0–100.0)
Monocytes Absolute: 0.3 10*3/uL (ref 0.1–1.0)
Monocytes Relative: 4.5 % (ref 3.0–12.0)
Neutro Abs: 4.3 10*3/uL (ref 1.4–7.7)
Neutrophils Relative %: 60.9 % (ref 43.0–77.0)
Platelets: 211 10*3/uL (ref 150.0–400.0)
RBC: 4.9 Mil/uL (ref 4.22–5.81)
RDW: 13.1 % (ref 11.5–15.5)
WBC: 7.1 10*3/uL (ref 4.0–10.5)

## 2021-11-16 LAB — LIPID PANEL
Cholesterol: 138 mg/dL (ref 0–200)
HDL: 43.5 mg/dL (ref >39.00)
LDL Cholesterol: 73 mg/dL (ref 0–99)
NonHDL: 94.81
Total CHOL/HDL Ratio: 3
Triglycerides: 107 mg/dL (ref 0.0–149.0)
VLDL: 21.4 mg/dL (ref 0.0–40.0)

## 2021-11-16 NOTE — Patient Instructions (Addendum)
Thanks for doing labs today If you have mychart- we will send your results within 3 business days of [place] receiving them.  If you do not have mychart- we will call you about results within 5 business days of [place] receiving them.  *please also note that you will see labs on mychart as soon as they post. I will later go in and write notes on them- will say "notes from Dr. Yong Channel"   Recommended follow up: Return in about 6 months (around 05/16/2022) for follow up- or sooner if needed. -make sure home BP stays <135/85 on average

## 2021-11-16 NOTE — Progress Notes (Signed)
Phone: [phone]   Subjective:  Patient presents today for their annual physical. Chief complaint-noted.   See problem oriented charting- ROS- full  review of systems was completed and negative  except for: hearing loss  The following were reviewed and entered/updated in epic: Past Medical History:  Diagnosis Date   B12 deficiency anemia    Diverticulosis    Family history of breast cancer    Hearing loss in left ear    essentially nonfunctioning cochlear implant   Hearing loss of right ear    Hyperlipidemia    Hypertension    Patient Active Problem List   Diagnosis Date Noted   Monoallelic mutation of PALB2 gene 09/26/2015    Priority: High   Cochlear implant in place 12/21/2013    Priority: High   Aortic atherosclerosis ([place]) 07/28/2021    Priority: Medium    CKD (chronic kidney disease), stage III ([place]) 11/16/2011    Priority: Medium    Essential hypertension 01/25/2008    Priority: Medium    Hyperlipidemia 05/03/2007    Priority: Medium    Family history of breast cancer     Priority: Low   Allergic rhinitis 10/16/2014    Priority: Low   History of skin cancer- follows with skin surgery center yearly  08/23/2013    Priority: Low   Trigeminal neuralgia 01/19/2012    Priority: Low   NEPHROLITHIASIS, RECURRENT 04/30/2010    Priority: Low   Vitamin B12 deficiency neuropathy ([place]) 11/01/2008    Priority: Low   Past Surgical History:  Procedure Laterality Date   COCHLEAR IMPLANT     left   COLONOSCOPY     KIDNEY STONE SURGERY     nasal polyp removal bilateral     skin cancer removal     2010-dermatology (sees derm regularly)    Family History  Problem Relation Age of Onset   Breast cancer Mother        dx in her 46s   Diabetes Mother    Hypertension Father    Heart disease Father        lieftime smoker   COPD Father    Cancer Maternal Aunt        1 maternal aunt with cancer NOS in her late 38s   Cancer Maternal Uncle        2 maternal uncles  with cancer NOS   Cancer Maternal Grandfather        cancer NOS   Hodgkin's lymphoma Cousin     Medications- reviewed and updated Current Outpatient Medications  Medication Sig Dispense Refill   aspirin 81 MG tablet Take 81 mg by mouth daily.     atorvastatin (LIPITOR) 40 MG tablet Take 1 tablet (40 mg total) by mouth daily. 90 tablet 3   Cholecalciferol (VITAMIN D3) 2000 UNITS TABS Take 2,000 Units by mouth daily.     Cyanocobalamin (VITAMIN B-12 PO) Take by mouth.     lisinopril (ZESTRIL) 20 MG tablet Take 1 tablet (20 mg total) by mouth daily. 90 tablet 3   niacin 500 MG CR capsule Take 500 mg by mouth at bedtime.     omeprazole (PRILOSEC) 20 MG capsule Take 1 capsule (20 mg total) by mouth daily. 30 capsule 0   terbinafine (LAMISIL) 250 MG tablet Take 1 tablet (250 mg total) by mouth daily. For toenail fungus/onychomycosis 90 tablet 0   No current facility-administered medications for this visit.    Allergies-reviewed and updated No Known Allergies  Social History  Social History Narrative   Married (wife Baker Janus patient of dr. Yong Channel). 3 children -2 twin girls and son, 87 grandchildren      Fully retired- worked for [employer] until age 27      Hobbies: formerly played tennis and runs now mostly grandkids time and yardwork   Objective  Objective:  BP 122/72    Pulse 61    Temp 98.7 F (37.1 C)    Ht _0  (1.702 m)    Wt 144 lb 9.6 oz (65.6 kg)    SpO2 96%    BMI 22.65 kg/m  Gen: NAD, resting comfortably HEENT: Mucous membranes are moist. Oropharynx normal Neck: no thyromegaly CV: RRR no murmurs rubs or gallops Lungs: CTAB no crackles, wheeze, rhonchi Abdomen: soft/nontender/nondistended/normal bowel sounds. No rebound or guarding.  Ext: no edema Skin: warm, dry, thickened yellow toenail more on medial side on left foot Neuro: grossly normal, moves all extremities, PERRLA    Assessment and Plan  76 y.o. male presenting for annual physical.  Health Maintenance  counseling: 1. Anticipatory guidance: Patient counseled regarding regular dental exams -q6 months- went to [facility], eye exams -yearly,  avoiding smoking and second hand smoke, limiting alcohol to 2 beverages per day-none current due to terbinafine but usually 2-3 a month.  No illicit drugs.  2. Risk factor reduction:  Advised patient of need for regular exercise and diet rich and fruits and vegetables to reduce risk of heart attack and stroke.  Exercise- less in winter- doesn't like the cold- in sarmer months walking and cycling at beach and a lot of yardwork here.  Encouraged to look for ways to do in winter- is going to try do do in the winter.  Diet/weight management-reasonably healthy other than ice cream every night.  Wt Readings from Last 3 Encounters:  11/16/21 144 lb 9.6 oz (65.6 kg)  07/28/21 144 lb (65.3 kg)  05/04/21 144 lb (65.3 kg)  3. Immunizations/screenings/ancillary studies DISCUSSED:  -COVID booster vaccination #4- has opted to hold off bivalent -Shingrix vaccination #1- wants to hold off on shingrix Immunization History  Administered Date(s) Administered   Fluad Quad(high Dose 65+) 06/26/2019, 07/24/2020, 06/04/2021   H1N1 09/23/2008   Hep A / Hep B 09/23/2008, 01/17/2009, 04/11/2009   Hepatitis A 09/23/2008   Hepatitis B 09/23/2008   Influenza Whole 07/24/2009, 07/21/2010, 07/02/2012   Influenza, High Dose Seasonal PF 07/11/2017, 07/27/2018   Influenza-Unspecified 07/04/2014, 07/06/2015, 07/07/2016   PFIZER(Purple Top)SARS-COV-2 Vaccination 11/04/2019, 11/27/2019, 08/13/2020   Pneumococcal Conjugate-13 08/14/2013   Pneumococcal Polysaccharide-23 06/19/2012   Td 09/20/2001   Tdap 10/16/2014   4. Prostate cancer screening-monoallelic mutation of PALB2 gene- PSA trend reassuring last year- update PSA baed on mutation Lab Results  Component Value Date   PSA 1.97 11/03/2020   PSA 1.6 04/29/2020   PSA 2.39 10/31/2019   5. Colon cancer screening - feb 2022  -cologuard negative- will age out 85. Skin cancer screening-  sees dermatology- last seen November. advised regular sunscreen use. Denies worrisome, changing, or new skin lesions.  7. Smoking associated screening (lung cancer screening, AAA screen 65-75, UA)- Never smoker 8. STD screening -  monogomous  Status of chronic or acute concerns   #Hypertension S: Compliant with lisinopril 20 mg daily -rarely checks at home BP Readings from Last 3 Encounters:  11/16/21 122/72  07/28/21 138/76  07/23/21 132/77  A/P: Controlled. Continue current medications.   #CKD stage III S: GFR has been stable around 60 . Blood pressure had been  controlled, lipids controlled. Knows to avoid NSAIDs. A/P: hopefully stable- update cmp today.   #Hyperlipidemia S: Compliant with atorvastatin 40 mg daily. He also preferred to continue aspirin for primary prevention. also takes niacin Lab Results  Component Value Date   CHOL 152 11/03/2020   HDL 46.00 11/03/2020   LDLCALC 79 11/03/2020   LDLDIRECT 65.3 01/29/2010   TRIG 133.0 11/03/2020   CHOLHDL 3 11/03/2020   A/P: close to ideal goal on last check- update lipid panel today- likely continue current meds unless LDL over 100 especially on terbinafine  #Vitamin B12 deficiency/neuropathy S: . Neuropathy largely resolved on B complex-rare tingling into both legs however no recent issues  -today on 2555mg - half tablet nightly. Folbic not covered. No tingling in legs at present A/P: hopefully stable- update b12 today. Continue current meds for now    #Onychomycosis- started on pills about a month ago terbinafine- no substantial change- we will check LFTs today. May have dystrophic nail element as well- if doesn't improve within 3 months- consider podiatry referral   # GERD S:Medication: ended up taking pepcid 2 tablets and then got better - not on omeprazole A/P: doing well without meds-  continue to monitor   Recommended follow up: No follow-ups on  file.  Lab/Order associations: fasting   ICD-10-CM   1. Preventative health care  Z00.00     2. Essential hypertension  I10     3. Hyperlipidemia, unspecified hyperlipidemia type  E78.5 CBC with Differential/Platelet    Comprehensive metabolic panel    Lipid panel    4. Vitamin B12 deficiency neuropathy (HCC)  E53.8 Vitamin B12   G63     5. Stage 3 chronic kidney disease, unspecified whether stage 3a or 3b CKD (HCC)  N18.30     6. Aortic atherosclerosis (HCC) Chronic I70.0       No orders of the defined types were placed in this encounter.  I,Jada Bradford,acting as a scribe for SGarret Reddish MD.,have documented all relevant documentation on the behalf of SGarret Reddish MD,as directed by  SGarret Reddish MD while in the presence of SGarret Reddish MD.  I, SGarret Reddish MD, have reviewed all documentation for this visit. The documentation on 11/16/21 for the exam, diagnosis, procedures, and orders are all accurate and complete.   Return precautions advised.  SGarret Reddish MD

## 2021-11-17 LAB — PSA: PSA: 1.88 ng/mL (ref 0.10–4.00)

## 2021-11-17 LAB — VITAMIN B12: Vitamin B-12: >1504 pg/mL — ABNORMAL HIGH (ref 211–911)

## 2022-01-28 ENCOUNTER — Telehealth: Payer: Self-pay | Admitting: Family Medicine

## 2022-01-28 NOTE — Telephone Encounter (Signed)
Spoke with spouse she stated they were moving and to call back 02/2022 ?

## 2022-02-04 DIAGNOSIS — H524 Presbyopia: Secondary | ICD-10-CM | POA: Diagnosis not present

## 2022-02-18 ENCOUNTER — Telehealth: Payer: Self-pay | Admitting: Family Medicine

## 2022-02-18 NOTE — Telephone Encounter (Signed)
Copied from [place] [phone]. Topic: Medicare AWV >> Feb 18, 2022  9:48 AM Harris-Coley, Hannah Beat wrote: Reason for CRM: Left message for patient to schedule Annual Wellness Visit.  Please schedule with Nurse Health Advisor Charlott Rakes, RN at [facility].  Please call [phone] ask for [facility]

## 2022-02-18 NOTE — Telephone Encounter (Signed)
Pt's wife called and stated they are out of town and do not wish to schedule at this time.

## 2022-05-17 ENCOUNTER — Ambulatory Visit (INDEPENDENT_AMBULATORY_CARE_PROVIDER_SITE_OTHER): Payer: Medicare Other | Admitting: Family Medicine

## 2022-05-17 ENCOUNTER — Encounter: Payer: Self-pay | Admitting: Family Medicine

## 2022-05-17 VITALS — BP 120/82 | HR 61 | Temp 97.8°F | Ht 67.0 in | Wt 139.8 lb

## 2022-05-17 DIAGNOSIS — E785 Hyperlipidemia, unspecified: Secondary | ICD-10-CM

## 2022-05-17 DIAGNOSIS — I10 Essential (primary) hypertension: Secondary | ICD-10-CM | POA: Diagnosis not present

## 2022-05-17 NOTE — Patient Instructions (Addendum)
Flu shot- we should have these available within a month or two but please let us know if you get at outside pharmacy(high dose)  No labs today- glad you are doing well  Recommended follow up: Return in about 6 months (around 11/17/2022) for physical or sooner if needed.Schedule b4 you leave.

## 2022-05-17 NOTE — Progress Notes (Signed)
Phone [phone] In person visit   Subjective:   Bryan Carson is a 76 y.o. year old very pleasant male patient who presents for/with See problem oriented charting Chief Complaint  Patient presents with   Follow-up   Hyperlipidemia   Past Medical History-  Patient Active Problem List   Diagnosis Date Noted   Monoallelic mutation of PALB2 gene 09/26/2015    Priority: High   Cochlear implant in place 12/21/2013    Priority: High   Aortic atherosclerosis ([place]) 07/28/2021    Priority: Medium    CKD (chronic kidney disease), stage III ([place]) 11/16/2011    Priority: Medium    Essential hypertension 01/25/2008    Priority: Medium    Hyperlipidemia 05/03/2007    Priority: Medium    Family history of breast cancer     Priority: Low   Allergic rhinitis 10/16/2014    Priority: Low   History of skin cancer- follows with skin surgery center yearly  08/23/2013    Priority: Low   Trigeminal neuralgia 01/19/2012    Priority: Low   NEPHROLITHIASIS, RECURRENT 04/30/2010    Priority: Low   Vitamin B12 deficiency neuropathy ([place]) 11/01/2008    Priority: Low    Medications- reviewed and updated Current Outpatient Medications  Medication Sig Dispense Refill   aspirin 81 MG tablet Take 81 mg by mouth daily.     atorvastatin (LIPITOR) 40 MG tablet Take 1 tablet (40 mg total) by mouth daily. 90 tablet 3   Cholecalciferol (VITAMIN D3) 2000 UNITS TABS Take 2,000 Units by mouth daily.     Cyanocobalamin (VITAMIN B-12 PO) Take by mouth.     lisinopril (ZESTRIL) 20 MG tablet Take 1 tablet (20 mg total) by mouth daily. 90 tablet 3   niacin 500 MG CR capsule Take 500 mg by mouth at bedtime.     No current facility-administered medications for this visit.     Objective:  BP 120/82   Pulse 61   Temp 97.8 F (36.6 C)   Ht 5' 7"  (1.702 m)   Wt 139 lb 12.8 oz (63.4 kg)   SpO2 97%   BMI 21.90 kg/m  Gen: NAD, resting comfortably CV: RRR no murmurs rubs or gallops Lungs: CTAB no crackles,  wheeze, rhonchi Abdomen: soft/nontender/nondistended/normal bowel sounds. Ext: no edema Skin: warm, dry     Assessment and Plan   #social update- moved from home in [place]- [place] now but still looking locally- looking at [facility]- waiting on this to be worked on/completed. Had some weight loss with moving out of home- down 5 lbs from last visit but regaining  #Hypertension S: Compliant with lisinopril 20 mg BP Readings from Last 3 Encounters:  05/17/22 120/82  11/16/21 122/72  07/28/21 138/76  A/P: Controlled. Continue current medications.     #Hyperlipidemia S: Compliant with atorvastatin 40 mg.  He also prefers to continue aspirin for primary prevention. also takes niacin Lab Results  Component Value Date   CHOL 138 11/16/2021   HDL 43.50 11/16/2021   LDLCALC 73 11/16/2021   LDLDIRECT 65.3 01/29/2010   TRIG 107.0 11/16/2021   CHOLHDL 3 11/16/2021  A/P: close to ideal goals- continue current medicine   #Vitamin B12 deficiency/neuropathy S: b12 1000 mcg otc daily Lab Results  Component Value Date   VITAMINB12 >1504 (H) 11/16/2021  A/P: b12 adequate last visit- recheck next visit   #onychomycosis- left and right great toe- treated with lamisil for 90 days in  January - stabilized the issue but has not fully rsolve-d wants to hold off on treatment unless worsens. Doesn't go beyond 50% of nail  Recommended follow up: Return in about 6 months (around 11/17/2022) for physical or sooner if needed.Schedule b4 you leave.  Lab/Order associations:   ICD-10-CM   1. Essential hypertension  I10     2. Hyperlipidemia, unspecified hyperlipidemia type  E78.5      Return precautions advised.  Garret Reddish, MD

## 2022-06-07 ENCOUNTER — Ambulatory Visit (INDEPENDENT_AMBULATORY_CARE_PROVIDER_SITE_OTHER): Payer: Medicare Other

## 2022-06-07 DIAGNOSIS — Z23 Encounter for immunization: Secondary | ICD-10-CM | POA: Diagnosis not present

## 2022-08-20 ENCOUNTER — Other Ambulatory Visit: Payer: Self-pay | Admitting: Family Medicine

## 2022-10-08 ENCOUNTER — Ambulatory Visit (INDEPENDENT_AMBULATORY_CARE_PROVIDER_SITE_OTHER): Payer: Medicare Other

## 2022-10-08 VITALS — Wt 141.0 lb

## 2022-10-08 DIAGNOSIS — Z0000 Encounter for general adult medical examination without abnormal findings: Secondary | ICD-10-CM | POA: Diagnosis not present

## 2022-10-08 NOTE — Patient Instructions (Signed)
[name] , Thank you for taking time to come for your Medicare Wellness Visit. I appreciate your ongoing commitment to your health goals. Please review the following plan we discussed and let me know if I can assist you in the future.   These are the goals we discussed:  Goals      Patient Stated     Get back to exercise         This is a list of the screening recommended for you and due dates:  Health Maintenance  Topic Date Due   COVID-19 Vaccine (4 - 2023-24 season) 05/21/2022   Zoster (Shingles) Vaccine (1 of 2) 01/07/2023*   Medicare Annual Wellness Visit  10/09/2023   Cologuard (Stool DNA test)  02/12/2024   DTaP/Tdap/Td vaccine (3 - Td or Tdap) 10/16/2024   Pneumonia Vaccine  Completed   Flu Shot  Completed   Hepatitis C Screening: USPSTF Recommendation to screen - Ages 18-79 yo.  Completed   HPV Vaccine  Aged Out   Colon Cancer Screening  Discontinued  *Topic was postponed. The date shown is not the original due date.    Advanced directives: Please bring a copy of your health care power of attorney and living will to the office at your convenience.  Conditions/risks identified: get back to exercise   Next appointment: Follow up in one year for your annual wellness visit.   Preventive Care 76 Years and Older, Male  Preventive care refers to lifestyle choices and visits with your health care provider that can promote health and wellness. What does preventive care include? A yearly physical exam. This is also called an annual well check. Dental exams once or twice a year. Routine eye exams. Ask your health care provider how often you should have your eyes checked. Personal lifestyle choices, including: Daily care of your teeth and gums. Regular physical activity. Eating a healthy diet. Avoiding tobacco and drug use. Limiting alcohol use. Practicing safe sex. Taking low doses of aspirin every day. Taking vitamin and mineral supplements as recommended by your  health care provider. What happens during an annual well check? The services and screenings done by your health care provider during your annual well check will depend on your age, overall health, lifestyle risk factors, and family history of disease. Counseling  Your health care provider may ask you questions about your: Alcohol use. Tobacco use. Drug use. Emotional well-being. Home and relationship well-being. Sexual activity. Eating habits. History of falls. Memory and ability to understand (cognition). Work and work Statistician. Screening  You may have the following tests or measurements: Height, weight, and BMI. Blood pressure. Lipid and cholesterol levels. These may be checked every 5 years, or more frequently if you are over 53 years old. Skin check. Lung cancer screening. You may have this screening every year starting at age 87 if you have a 30-pack-year history of smoking and currently smoke or have quit within the past 15 years. Fecal occult blood test (FOBT) of the stool. You may have this test every year starting at age 20. Flexible sigmoidoscopy or colonoscopy. You may have a sigmoidoscopy every 5 years or a colonoscopy every 10 years starting at age 91. Prostate cancer screening. Recommendations will vary depending on your family history and other risks. Hepatitis C blood test. Hepatitis B blood test. Sexually transmitted disease (STD) testing. Diabetes screening. This is done by checking your blood sugar (glucose) after you have not eaten for a while (fasting). You may have this  done every 1-3 years. Abdominal aortic aneurysm (AAA) screening. You may need this if you are a current or former smoker. Osteoporosis. You may be screened starting at age 76 if you are at high risk. Talk with your health care provider about your test results, treatment options, and if necessary, the need for more tests. Vaccines  Your health care provider may recommend certain vaccines, such  as: Influenza vaccine. This is recommended every year. Tetanus, diphtheria, and acellular pertussis (Tdap, Td) vaccine. You may need a Td booster every 10 years. Zoster vaccine. You may need this after age 24. Pneumococcal 13-valent conjugate (PCV13) vaccine. One dose is recommended after age 47. Pneumococcal polysaccharide (PPSV23) vaccine. One dose is recommended after age 28. Talk to your health care provider about which screenings and vaccines you need and how often you need them. This information is not intended to replace advice given to you by your health care provider. Make sure you discuss any questions you have with your health care provider. Document Released: 10/03/2015 Document Revised: 05/26/2016 Document Reviewed: 07/08/2015 Elsevier Interactive Patient Education  2017 [place] Prevention in the Home Falls can cause injuries. They can happen to people of all ages. There are many things you can do to make your home safe and to help prevent falls. What can I do on the outside of my home? Regularly fix the edges of walkways and driveways and fix any cracks. Remove anything that might make you trip as you walk through a door, such as a raised step or threshold. Trim any bushes or trees on the path to your home. Use bright outdoor lighting. Clear any walking paths of anything that might make someone trip, such as rocks or tools. Regularly check to see if handrails are loose or broken. Make sure that both sides of any steps have handrails. Any raised decks and porches should have guardrails on the edges. Have any leaves, snow, or ice cleared regularly. Use sand or salt on walking paths during winter. Clean up any spills in your garage right away. This includes oil or grease spills. What can I do in the bathroom? Use night lights. Install grab bars by the toilet and in the tub and shower. Do not use towel bars as grab bars. Use non-skid mats or decals in the tub or  shower. If you need to sit down in the shower, use a plastic, non-slip stool. Keep the floor dry. Clean up any water that spills on the floor as soon as it happens. Remove soap buildup in the tub or shower regularly. Attach bath mats securely with double-sided non-slip rug tape. Do not have throw rugs and other things on the floor that can make you trip. What can I do in the bedroom? Use night lights. Make sure that you have a light by your bed that is easy to reach. Do not use any sheets or blankets that are too big for your bed. They should not hang down onto the floor. Have a firm chair that has side arms. You can use this for support while you get dressed. Do not have throw rugs and other things on the floor that can make you trip. What can I do in the kitchen? Clean up any spills right away. Avoid walking on wet floors. Keep items that you use a lot in easy-to-reach places. If you need to reach something above you, use a strong step stool that has a grab bar. Keep electrical cords out of  the way. Do not use floor polish or wax that makes floors slippery. If you must use wax, use non-skid floor wax. Do not have throw rugs and other things on the floor that can make you trip. What can I do with my stairs? Do not leave any items on the stairs. Make sure that there are handrails on both sides of the stairs and use them. Fix handrails that are broken or loose. Make sure that handrails are as long as the stairways. Check any carpeting to make sure that it is firmly attached to the stairs. Fix any carpet that is loose or worn. Avoid having throw rugs at the top or bottom of the stairs. If you do have throw rugs, attach them to the floor with carpet tape. Make sure that you have a light switch at the top of the stairs and the bottom of the stairs. If you do not have them, ask someone to add them for you. What else can I do to help prevent falls? Wear shoes that: Do not have high heels. Have  rubber bottoms. Are comfortable and fit you well. Are closed at the toe. Do not wear sandals. If you use a stepladder: Make sure that it is fully opened. Do not climb a closed stepladder. Make sure that both sides of the stepladder are locked into place. Ask someone to hold it for you, if possible. Clearly mark and make sure that you can see: Any grab bars or handrails. First and last steps. Where the edge of each step is. Use tools that help you move around (mobility aids) if they are needed. These include: Canes. Walkers. Scooters. Crutches. Turn on the lights when you go into a dark area. Replace any light bulbs as soon as they burn out. Set up your furniture so you have a clear path. Avoid moving your furniture around. If any of your floors are uneven, fix them. If there are any pets around you, be aware of where they are. Review your medicines with your doctor. Some medicines can make you feel dizzy. This can increase your chance of falling. Ask your doctor what other things that you can do to help prevent falls. This information is not intended to replace advice given to you by your health care provider. Make sure you discuss any questions you have with your health care provider. Document Released: 07/03/2009 Document Revised: 02/12/2016 Document Reviewed: 10/11/2014 Elsevier Interactive Patient Education  2017 [employer].

## 2022-10-08 NOTE — Progress Notes (Signed)
I connected with  Bryan Carson on 10/08/22 by a audio enabled telemedicine application and verified that I am speaking with the correct person using two identifiers.  Patient Location: Home  Provider Location: Office/Clinic  I discussed the limitations of evaluation and management by telemedicine. The patient expressed understanding and agreed to proceed.  Subjective:   Bryan Carson is a 77 y.o. male who presents for Medicare Annual/Subsequent preventive examination.  Review of Systems     Cardiac Risk Factors include: advanced age (>79mn, >>26women);dyslipidemia;male gender;hypertension     Objective:    Today's Vitals   10/08/22 1351  Weight: 141 lb (64 kg)   Body mass index is 22.08 kg/m.     10/08/2022    2:03 PM 07/23/2021    4:12 PM  Advanced Directives  Does Patient Have a Medical Advance Directive? Yes No  Type of [place]   Copy of [place] Chart? No - copy requested     Current Medications (verified) Outpatient Encounter Medications as of 10/08/2022  Medication Sig   aspirin 81 MG tablet Take 81 mg by mouth daily.   atorvastatin (LIPITOR) 40 MG tablet TAKE 1 TABLET BY MOUTH DAILY   Cholecalciferol (VITAMIN D3) 2000 UNITS TABS Take 2,000 Units by mouth daily.   Cyanocobalamin (VITAMIN B-12 PO) Take by mouth.   lisinopril (ZESTRIL) 20 MG tablet TAKE 1 TABLET BY MOUTH DAILY   niacin 500 MG CR capsule Take 500 mg by mouth at bedtime.   No facility-administered encounter medications on file as of 10/08/2022.    Allergies (verified) Patient has no known allergies.   History: Past Medical History:  Diagnosis Date   B12 deficiency anemia    Diverticulosis    Family history of breast cancer    Hearing loss in left ear    essentially nonfunctioning cochlear implant   Hearing loss of right ear    Hyperlipidemia    Hypertension    Past Surgical History:  Procedure Laterality Date    COCHLEAR IMPLANT     left   COLONOSCOPY     KIDNEY STONE SURGERY     nasal polyp removal bilateral     skin cancer removal     2010-dermatology (sees derm regularly)   Family History  Problem Relation Age of Onset   Breast cancer Mother        dx in her 683s  Diabetes Mother    Hypertension Father    Heart disease Father        lieftime smoker   COPD Father    Cancer Maternal Aunt        1 maternal aunt with cancer NOS in her late 434s  Cancer Maternal Uncle        2 maternal uncles with cancer NOS   Cancer Maternal Grandfather        cancer NOS   Hodgkin's lymphoma Cousin    Social History   Socioeconomic History   Marital status: Married    Spouse name: Not on file   Number of children: 3   Years of education: Not on file   Highest education level: Not on file  Occupational History   Not on file  Tobacco Use   Smoking status: Never   Smokeless tobacco: Never  Substance and Sexual Activity   Alcohol use: Yes    Alcohol/week: 0.0 standard drinks of alcohol    Comment: 1 a month  Drug use: No   Sexual activity: Yes  Other Topics Concern   Not on file  Social History Narrative   Married (wife Bryan Carson patient of dr. Yong Carson). 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   Social Determinants of Radio broadcast assistant Strain: [place]  (10/08/2022)   Overall Financial Resource Strain (CARDIA)    Difficulty of Paying Living Expenses: Not hard at all  Food Insecurity: No Food Insecurity (10/08/2022)   Hunger Vital Sign    Worried About Running Out of Food in the Last Year: Never true    [place] in the Last Year: Never true  Transportation Needs: No Transportation Needs (10/08/2022)   PRAPARE - Hydrologist (Medical): No    Lack of Transportation (Non-Medical): No  Physical Activity:  Insufficiently Active (10/08/2022)   Exercise Vital Sign    Days of Exercise per Week: 4 days    Minutes of Exercise per Session: 30 min  Stress: No Stress Concern Present (10/08/2022)   [place]    Feeling of Stress : Not at all  Social Connections: Moderately Integrated (10/08/2022)   Social Connection and Isolation Panel [NHANES]    Frequency of Communication with Friends and Family: Twice a week    Frequency of Social Gatherings with Friends and Family: Three times a week    Attends Religious Services: More than 4 times per year    Active Member of Clubs or Organizations: No    Attends Archivist Meetings: Never    Marital Status: Married    Tobacco Counseling Counseling given: Not Answered   Clinical Intake:  Pre-visit preparation completed: Yes  Pain : No/denies pain     BMI - recorded: 22.08 Nutritional Status: BMI of 19-24  Normal Nutritional Risks: None Diabetes: No  How often do you need to have someone help you when you read instructions, pamphlets, or other written materials from your doctor or pharmacy?: 1 - Never  Diabetic?no  Interpreter Needed?: No  Information entered by :: Charlott Rakes ,LPN   Activities of Daily Living    10/08/2022    2:04 PM  In your present state of health, do you have any difficulty performing the following activities:  Hearing? 1  Comment pt wears hearing aids  Vision? 0  Difficulty concentrating or making decisions? 0  Walking or climbing stairs? 0  Dressing or bathing? 0  Doing errands, shopping? 0  Preparing Food and eating ? N  Using the Toilet? N  In the past six months, have you accidently leaked urine? N  Do you have problems with loss of bowel control? N  Managing your Medications? N  Managing your Finances? N  Housekeeping or managing your Housekeeping? N    Patient Care Team: Bryan Olp, MD as PCP - General (Family  Medicine) Bryan Carls, MD as Referring Physician (Specialist) Bryan Slim Neena Rhymes, MD as Consulting Physician (Ophthalmology) Bryan Baptist, MD as Consulting Physician (Otolaryngology)  Indicate any recent Medical Services you may have received from other than [facility] providers in the past year (date may be approximate).     Assessment:   This is a routine wellness examination for [name].  Hearing/Vision screen Hearing Screening - Comments:: Pt wears  hearing aids  Vision Screening - Comments:: Pt follows up with Dr Bryan Slim for annual eye exams   Dietary issues and exercise activities discussed: Current Exercise Habits: Home exercise routine, Type of exercise: walking, Time (Minutes): 30, Frequency (Times/Week): 4, Weekly Exercise (Minutes/Week): 120   Goals Addressed             This Visit's Progress    Patient Stated       Get back to exercise        Depression Screen    10/08/2022    1:55 PM 05/17/2022   11:34 AM 05/04/2021    9:38 AM 11/03/2020   10:40 AM 04/29/2020   10:52 AM 10/31/2019    9:48 AM 10/26/2018   10:12 AM  PHQ 2/9 Scores  PHQ - 2 Score 0 0 0 0 0 0 0  PHQ- 9 Score  0   0 0     Fall Risk    10/08/2022    2:04 PM 05/17/2022   11:33 AM 05/04/2021    9:38 AM 11/03/2020   10:40 AM 10/31/2019    9:42 AM  Fall Risk   Falls in the past year? 0 0 0 0 0  Number falls in past yr: 0 0 0 0 0  Injury with Fall? 0 0 0 0 0  Risk for fall due to : Impaired vision No Fall Risks No Fall Risks  History of fall(s)  Follow up Falls prevention discussed Falls evaluation completed Falls evaluation completed      FALL RISK PREVENTION PERTAINING TO THE HOME:  Any stairs in or around the home? Yes  If so, are there any without handrails? No  Home free of loose throw rugs in walkways, pet beds, electrical cords, etc? Yes  Adequate lighting in your home to reduce risk of falls? Yes   ASSISTIVE DEVICES UTILIZED TO PREVENT FALLS:  Life alert? No  Use of a cane, walker or w/c?  No  Grab bars in the bathroom? No  Shower chair or bench in shower? Yes  Elevated toilet seat or a handicapped toilet? No   TIMED UP AND GO:  Was the test performed? No .   Cognitive Function:pt declined         Immunizations Immunization History  Administered Date(s) Administered   Fluad Quad(high Dose 65+) 06/26/2019, 07/24/2020, 06/04/2021, 06/07/2022   H1N1 09/23/2008   Hep A / Hep B 09/23/2008, 01/17/2009, 04/11/2009   Hepatitis A 09/23/2008   Hepatitis B 09/23/2008   Influenza Whole 07/24/2009, 07/21/2010, 07/02/2012   Influenza, High Dose Seasonal PF 07/11/2017, 07/27/2018   Influenza-Unspecified 07/04/2014, 07/06/2015, 07/07/2016   PFIZER(Purple Top)SARS-COV-2 Vaccination 11/04/2019, 11/27/2019, 08/13/2020   Pneumococcal Conjugate-13 08/14/2013   Pneumococcal Polysaccharide-23 06/19/2012   Td 09/20/2001   Tdap 10/16/2014    TDAP status: Up to date  Flu Vaccine status: Up to date  Pneumococcal vaccine status: Up to date  Covid-19 vaccine status: Completed vaccines  Qualifies for Shingles Vaccine? Yes   Zostavax completed No   Shingrix Completed?: No.    Education has been provided regarding the importance of this vaccine. Patient has been advised to call insurance company to determine out of pocket expense if they have not yet received this vaccine. Advised may also receive vaccine at local pharmacy or Health Dept. Verbalized acceptance and understanding.  Screening Tests Health Maintenance  Topic Date Due   COVID-19 Vaccine (4 - 2023-24 season) 05/21/2022   Zoster Vaccines- Shingrix (1 of 2) 01/07/2023 (Originally 02/25/1997)  Medicare Annual Wellness (AWV)  10/09/2023   Fecal DNA (Cologuard)  02/12/2024   DTaP/Tdap/Td (3 - Td or Tdap) 10/16/2024   Pneumonia Vaccine 71+ Years old  Completed   INFLUENZA VACCINE  Completed   Hepatitis C Screening  Completed   HPV VACCINES  Aged Out   COLONOSCOPY (Pts 45-67yr Insurance coverage will need to be confirmed)   Discontinued    Health Maintenance  Health Maintenance Due  Topic Date Due   COVID-19 Vaccine (4 - 2023-24 season) 05/21/2022    Colorectal cancer screening: Type of screening: Cologuard. Completed 02/11/21. Repeat every 3 years  Additional Screening:  Hepatitis C Screening: Completed 10/25/17  Vision Screening: Recommended annual ophthalmology exams for early detection of glaucoma and other disorders of the eye. Is the patient up to date with their annual eye exam?  Yes  Who is the provider or what is the name of the office in which the patient attends annual eye exams? Dr MAlanda Slim If pt is not established with a provider, would they like to be referred to a provider to establish care? No .   Dental Screening: Recommended annual dental exams for proper oral hygiene  Community Resource Referral / Chronic Care Management: CRR required this visit?  No   CCM required this visit?  No      Plan:     I have personally reviewed and noted the following in the patient's chart:   Medical and social history Use of alcohol, tobacco or illicit drugs  Current medications and supplements including opioid prescriptions. Patient is not currently taking opioid prescriptions. Functional ability and status Nutritional status Physical activity Advanced directives List of other physicians Hospitalizations, surgeries, and ER visits in previous 12 months Vitals Screenings to include cognitive, depression, and falls Referrals and appointments  In addition, I have reviewed and discussed with patient certain preventive protocols, quality metrics, and best practice recommendations. A written personalized care plan for preventive services as well as general preventive health recommendations were provided to patient.     TWillette Brace LPN   [id number]/40/9811  Nurse Notes: Pt stated he plays sudoku every day , he was able to recite with knowledge the date and year, declined 6 CIT at this time

## 2022-11-03 ENCOUNTER — Encounter: Payer: Self-pay | Admitting: Family Medicine

## 2022-11-18 ENCOUNTER — Encounter: Payer: Self-pay | Admitting: Family Medicine

## 2022-11-18 ENCOUNTER — Ambulatory Visit (INDEPENDENT_AMBULATORY_CARE_PROVIDER_SITE_OTHER): Payer: Medicare Other | Admitting: Family Medicine

## 2022-11-18 VITALS — BP 120/70 | HR 63 | Temp 98.1°F | Ht 67.0 in | Wt 143.6 lb

## 2022-11-18 DIAGNOSIS — E785 Hyperlipidemia, unspecified: Secondary | ICD-10-CM | POA: Diagnosis not present

## 2022-11-18 DIAGNOSIS — I700 Atherosclerosis of aorta: Secondary | ICD-10-CM

## 2022-11-18 DIAGNOSIS — R351 Nocturia: Secondary | ICD-10-CM | POA: Diagnosis not present

## 2022-11-18 DIAGNOSIS — Z0000 Encounter for general adult medical examination without abnormal findings: Secondary | ICD-10-CM

## 2022-11-18 DIAGNOSIS — E538 Deficiency of other specified B group vitamins: Secondary | ICD-10-CM

## 2022-11-18 DIAGNOSIS — Z1501 Genetic susceptibility to malignant neoplasm of breast: Secondary | ICD-10-CM

## 2022-11-18 DIAGNOSIS — N183 Chronic kidney disease, stage 3 unspecified: Secondary | ICD-10-CM

## 2022-11-18 DIAGNOSIS — Z125 Encounter for screening for malignant neoplasm of prostate: Secondary | ICD-10-CM

## 2022-11-18 DIAGNOSIS — G63 Polyneuropathy in diseases classified elsewhere: Secondary | ICD-10-CM

## 2022-11-18 DIAGNOSIS — Z1509 Genetic susceptibility to other malignant neoplasm: Secondary | ICD-10-CM | POA: Diagnosis not present

## 2022-11-18 DIAGNOSIS — I10 Essential (primary) hypertension: Secondary | ICD-10-CM

## 2022-11-18 DIAGNOSIS — Z1589 Genetic susceptibility to other disease: Secondary | ICD-10-CM | POA: Diagnosis not present

## 2022-11-18 LAB — CBC WITH DIFFERENTIAL/PLATELET
Basophils Absolute: 0.1 10*3/uL (ref 0.0–0.1)
Basophils Relative: 0.9 % (ref 0.0–3.0)
Eosinophils Absolute: 0.5 10*3/uL (ref 0.0–0.7)
Eosinophils Relative: 6 % — ABNORMAL HIGH (ref 0.0–5.0)
HCT: 46.1 % (ref 39.0–52.0)
Hemoglobin: 15.3 g/dL (ref 13.0–17.0)
Lymphocytes Relative: 28.8 % (ref 12.0–46.0)
Lymphs Abs: 2.4 10*3/uL (ref 0.7–4.0)
MCHC: 33.2 g/dL (ref 30.0–36.0)
MCV: 88.2 fl (ref 78.0–100.0)
Monocytes Absolute: 0.5 10*3/uL (ref 0.1–1.0)
Monocytes Relative: 6.5 % (ref 3.0–12.0)
Neutro Abs: 4.8 10*3/uL (ref 1.4–7.7)
Neutrophils Relative %: 57.8 % (ref 43.0–77.0)
Platelets: 210 10*3/uL (ref 150.0–400.0)
RBC: 5.22 Mil/uL (ref 4.22–5.81)
RDW: 13.5 % (ref 11.5–15.5)
WBC: 8.3 10*3/uL (ref 4.0–10.5)

## 2022-11-18 LAB — LIPID PANEL
Cholesterol: 143 mg/dL (ref 0–200)
HDL: 40.4 mg/dL (ref >39.00)
LDL Cholesterol: 77 mg/dL (ref 0–99)
NonHDL: 102.75
Total CHOL/HDL Ratio: 4
Triglycerides: 131 mg/dL (ref 0.0–149.0)
VLDL: 26.2 mg/dL (ref 0.0–40.0)

## 2022-11-18 LAB — COMPREHENSIVE METABOLIC PANEL
ALT: 19 U/L (ref 0–53)
AST: 16 U/L (ref 0–37)
Albumin: 4.3 g/dL (ref 3.5–5.2)
Alkaline Phosphatase: 87 U/L (ref 39–117)
BUN: 20 mg/dL (ref 6–23)
CO2: 30 mEq/L (ref 19–32)
Calcium: 10.4 mg/dL (ref 8.4–10.5)
Chloride: 104 mEq/L (ref 96–112)
Creatinine, Ser: 1.34 mg/dL (ref 0.40–1.50)
GFR: 51.74 mL/min — ABNORMAL LOW (ref >60.00)
Glucose, Bld: 89 mg/dL (ref 70–99)
Potassium: 5.3 mEq/L — ABNORMAL HIGH (ref 3.5–5.1)
Sodium: 143 mEq/L (ref 135–145)
Total Bilirubin: 1.6 mg/dL — ABNORMAL HIGH (ref 0.2–1.2)
Total Protein: 6.7 g/dL (ref 6.0–8.3)

## 2022-11-18 LAB — VITAMIN B12: Vitamin B-12: 1165 pg/mL — ABNORMAL HIGH (ref 211–911)

## 2022-11-18 LAB — PSA: PSA: 2.31 ng/mL (ref 0.10–4.00)

## 2022-11-18 NOTE — Patient Instructions (Addendum)
Please stop by lab before you go If you have mychart- we will send your results within 3 business days of [place] receiving them.  If you do not have mychart- we will call you about results within 5 business days of [place] receiving them.  *please also note that you will see labs on mychart as soon as they post. I will later go in and write notes on them- will say "notes from Dr. Yong Channel"   Recommended follow up: Return in about 6 months (around 05/19/2023) for followup or sooner if needed.Schedule b4 you leave.

## 2022-11-18 NOTE — Progress Notes (Signed)
Phone: [phone]   Subjective:  Patient presents today for their annual physical. Chief complaint-noted.   See problem oriented charting- ROS- full  review of systems was completed and negative  except for: hearing loss, some lightheadedness but not worsening  The following were reviewed and entered/updated in epic: Past Medical History:  Diagnosis Date   B12 deficiency anemia    Diverticulosis    Family history of breast cancer    Hearing loss in left ear    essentially nonfunctioning cochlear implant   Hearing loss of right ear    Hyperlipidemia    Hypertension    Patient Active Problem List   Diagnosis Date Noted   Monoallelic mutation of PALB2 gene 09/26/2015    Priority: High   Cochlear implant in place 12/21/2013    Priority: High   Aortic atherosclerosis ([place]) 07/28/2021    Priority: Medium    CKD (chronic kidney disease), stage III ([place]) 11/16/2011    Priority: Medium    Essential hypertension 01/25/2008    Priority: Medium    Hyperlipidemia 05/03/2007    Priority: Medium    Family history of breast cancer     Priority: Low   Allergic rhinitis 10/16/2014    Priority: Low   History of skin cancer- follows with skin surgery center yearly  08/23/2013    Priority: Low   Trigeminal neuralgia 01/19/2012    Priority: Low   NEPHROLITHIASIS, RECURRENT 04/30/2010    Priority: Low   Vitamin B12 deficiency neuropathy ([place]) 11/01/2008    Priority: Low   Past Surgical History:  Procedure Laterality Date   COCHLEAR IMPLANT     left   COLONOSCOPY     KIDNEY STONE SURGERY     nasal polyp removal bilateral     skin cancer removal     2010-dermatology (sees derm regularly)    Family History  Problem Relation Age of Onset   Breast cancer Mother        dx in her 74s   Diabetes Mother    Hypertension Father    Heart disease Father        lieftime smoker   COPD Father    Cancer Maternal Aunt        1 maternal aunt with cancer NOS in her late 59s   Cancer  Maternal Uncle        2 maternal uncles with cancer NOS   Cancer Maternal Grandfather        cancer NOS   Hodgkin's lymphoma Cousin     Medications- reviewed and updated Current Outpatient Medications  Medication Sig Dispense Refill   aspirin 81 MG tablet Take 81 mg by mouth daily.     atorvastatin (LIPITOR) 40 MG tablet TAKE 1 TABLET BY MOUTH DAILY 90 tablet 3   Cholecalciferol (VITAMIN D3) 2000 UNITS TABS Take 2,000 Units by mouth daily.     Cyanocobalamin (VITAMIN B-12 PO) Take by mouth.     lisinopril (ZESTRIL) 20 MG tablet TAKE 1 TABLET BY MOUTH DAILY 90 tablet 3   niacin 500 MG CR capsule Take 500 mg by mouth at bedtime.     No current facility-administered medications for this visit.    Allergies-reviewed and updated No Known Allergies  Social History   Social History Narrative   Married (wife Baker Janus patient of dr. Yong Channel). 3 children -2 twin girls and son, 56 grandchildren      Fully retired- worked for [employer] until age 41      Hobbies: formerly  played tennis and runs now mostly grandkids time and yardwork   Objective  Objective:  BP 120/70   Pulse 63   Temp 98.1 F (36.7 C)   Ht '5\' 7"'$  (1.702 m)   Wt 143 lb 9.6 oz (65.1 kg)   SpO2 97%   BMI 22.49 kg/m  Gen: NAD, resting comfortably HEENT: Mucous membranes are moist. Oropharynx normal Neck: no thyromegaly CV: RRR no murmurs rubs or gallops Lungs: CTAB no crackles, wheeze, rhonchi Abdomen: soft/nontender/nondistended/normal bowel sounds. No rebound or guarding.  Ext: no edema Skin: warm, dry Neuro: grossly normal, moves all extremities, PERRLA   Assessment and Plan  77 y.o. male presenting for annual physical.  Health Maintenance counseling: 1. Anticipatory guidance: Patient counseled regarding regular dental exams - generally q6 months-  plans to do with [place], eye exams -yearly- plans to update- some vision change,  avoiding smoking and second hand smoke , limiting alcohol to 2 beverages per day - under 2 a  month, no illicit drugs .   2. Risk factor reduction:  Advised patient of need for regular exercise and diet rich and fruits and vegetables to reduce risk of heart attack and stroke.  Exercise- doing some- hoping to do more as gets warmer.  Diet/weight management-reasonably healthy diet for the most part.  Wt Readings from Last 3 Encounters:  11/18/22 143 lb 9.6 oz (65.1 kg)  10/08/22 141 lb (64 kg)  05/17/22 139 lb 12.8 oz (63.4 kg)  3. Immunizations/screenings/ancillary studies- declines covid shot and RSV Immunization History  Administered Date(s) Administered   Fluad Quad(high Dose 65+) 06/26/2019, 07/24/2020, 06/04/2021, 06/07/2022   H1N1 09/23/2008   Hep A / Hep B 09/23/2008, 01/17/2009, 04/11/2009   Hepatitis A 09/23/2008   Hepatitis B 09/23/2008   Influenza Whole 07/24/2009, 07/21/2010, 07/02/2012   Influenza, High Dose Seasonal PF 07/11/2017, 07/27/2018   Influenza-Unspecified 07/04/2014, 07/06/2015, 07/07/2016   PFIZER(Purple Top)SARS-COV-2 Vaccination 11/04/2019, 11/27/2019, 08/13/2020   Pneumococcal Conjugate-13 08/14/2013   Pneumococcal Polysaccharide-23 06/19/2012   Td 09/20/2001   Tdap 10/16/2014  4. Prostate cancer screening-  monoallelic mutation of PALB2 gene- PSA trend reassuring last year- update PSA baed on mutation . Nocturia probably around 4 am or so Lab Results  Component Value Date   PSA 1.88 11/16/2021   PSA 1.97 11/03/2020   PSA 1.6 04/29/2020  5. Colon cancer screening - feb 2022 -cologuard negative- will age out  8. Skin cancer screening-  sees dermatology- generally sees yearly but has had scome scheduling issues.  advised regular sunscreen use. Denies worrisome, changing, or new skin lesions.  7. Smoking associated screening (lung cancer screening, AAA screen 65-75, UA)- Never smoker  8. STD screening -  monogomous  Status of chronic or acute concerns   #social [place]- they are at least starting building at [place]  is started- they are in phase 1b  #Hypertension S: Compliant with lisinopril 20 mg BP Readings from Last 3 Encounters:  11/18/22 120/70  05/17/22 120/82  11/16/21 122/72  A/P: stable- continue current medicines   -Recommended every 56-monthvisit but he prefers yearly  #CKD stage III S: GFR has been stable around 50s.  Blood pressure has been controlled, lipids controlled.  Knows to avoid NSAIDs. A/P: hopefully stable- update cmp today.   #Hyperlipidemia S: Compliant with atorvastatin 40 mg.  He also prefers to continue aspirin for primary prevention. also takes niacin Lab Results  Component Value Date   CHOL 138 11/16/2021   HDL 43.50  11/16/2021   LDLCALC 73 11/16/2021   LDLDIRECT 65.3 01/29/2010   TRIG 107.0 11/16/2021   CHOLHDL 3 11/16/2021  A/P: very close to ideal goals- continue current medications   Aortic atherosclerosis (presumed stable)- LDL goal ideally <70 - very close- continue current medications   #Vitamin B12 deficiency/neuropathy S: medication: 1000 mcg daily  prior with folbic in past but no longer covered by insurance.  Neuropathy largely resolved on B complex-rare tingling into both legs- no recent issues Lab Results  Component Value Date   VITAMINB12 >1504 ([facility]) 11/16/2021  A/P: hopefully stable- update b12 today. Continue current meds for now   Recommended follow up: Return in about 6 months (around 05/19/2023) for followup or sooner if needed.Schedule b4 you leave. Future Appointments  Date Time Provider [place]  10/17/2023  1:30 PM [facility]   Lab/Order associations: fasting   ICD-10-CM   1. Preventative health care  Z00.00     2. Hyperlipidemia, unspecified hyperlipidemia type  E78.5 CBC with Differential/Platelet    Comprehensive metabolic panel    Lipid panel    3. Aortic atherosclerosis (HCC)  I70.0     4. Essential hypertension  I10     5. Vitamin B12 deficiency neuropathy (HCC)  E53.8 Vitamin B12   G63      6. Stage 3 chronic kidney disease, unspecified whether stage 3a or 3b CKD (HCC) Chronic N18.30     7. Monoallelic mutation of PALB2 gene  Z15.01 PSA   Z15.89    Z15.09     8. Nocturia  R35.1 PSA      No orders of the defined types were placed in this encounter.   Return precautions advised.  Garret Reddish, MD

## 2022-12-02 ENCOUNTER — Telehealth: Payer: Self-pay | Admitting: Family Medicine

## 2022-12-02 NOTE — Telephone Encounter (Signed)
See below

## 2022-12-02 NOTE — Telephone Encounter (Signed)
Caller states: -Patient got notice from insurance stating cpe on 11/18/22 could not be billed due to claim being denied  - Claim denied due to code used not following CMS guidelines - Code specified in letter: [phone]  Please Advise.

## 2022-12-02 NOTE — Telephone Encounter (Signed)
Please let patient know that our coder supervisor has looked at this and make correction and sent back to insurance

## 2023-04-20 ENCOUNTER — Encounter (INDEPENDENT_AMBULATORY_CARE_PROVIDER_SITE_OTHER): Payer: Self-pay

## 2023-05-20 ENCOUNTER — Ambulatory Visit: Payer: Medicare Other | Admitting: Family Medicine

## 2023-07-19 ENCOUNTER — Other Ambulatory Visit: Payer: Self-pay

## 2023-07-27 ENCOUNTER — Encounter: Payer: Self-pay | Admitting: Family Medicine

## 2023-08-04 ENCOUNTER — Ambulatory Visit: Payer: Medicare Other | Admitting: Family Medicine

## 2023-08-16 ENCOUNTER — Encounter: Payer: Self-pay | Admitting: Family Medicine

## 2023-08-17 ENCOUNTER — Other Ambulatory Visit: Payer: Self-pay

## 2023-08-17 MED ORDER — ATORVASTATIN CALCIUM 40 MG PO TABS: 40.0000 mg | ORAL_TABLET | Freq: Every day | ORAL | 3 refills | Status: DC

## 2023-08-17 MED ORDER — LISINOPRIL 20 MG PO TABS: 20.0000 mg | ORAL_TABLET | Freq: Every day | ORAL | 3 refills | Status: DC

## 2023-08-23 IMAGING — US US SCROTUM W/ DOPPLER COMPLETE
1 series · 14 of 25 positions shown · non-contrast
Comparison: None.

CLINICAL DATA: Left-sided scrotal swelling and pain.

EXAM:
SCROTAL ULTRASOUND
DOPPLER ULTRASOUND OF THE TESTICLES
TECHNIQUE: Complete ultrasound examination of the testicles, epididymis, and
other scrotal structures was performed. Color and spectral Doppler
ultrasound were also utilized to evaluate blood flow to the
testicles.

[Series 1: us scrotum w/doppler · 14 of 51 slices shown]
[im 1/51]
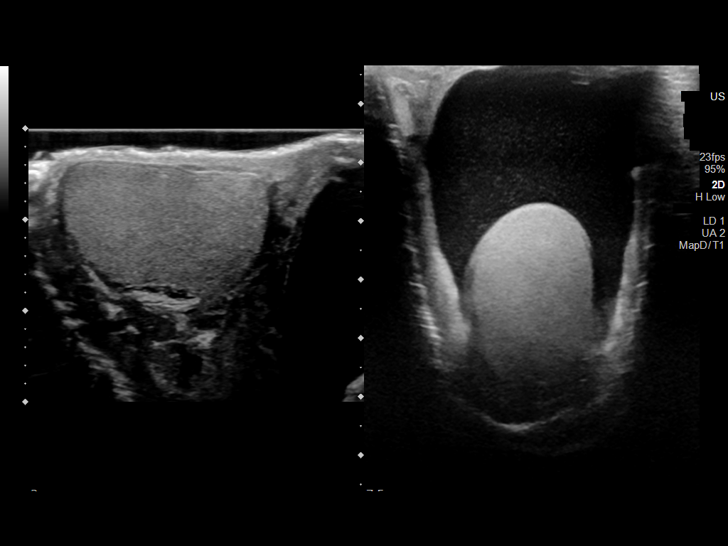
[im 5/51]
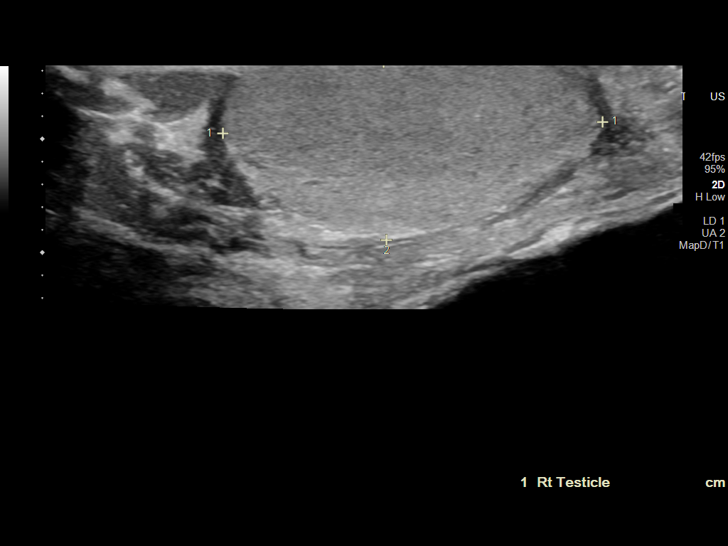
[im 9/51]
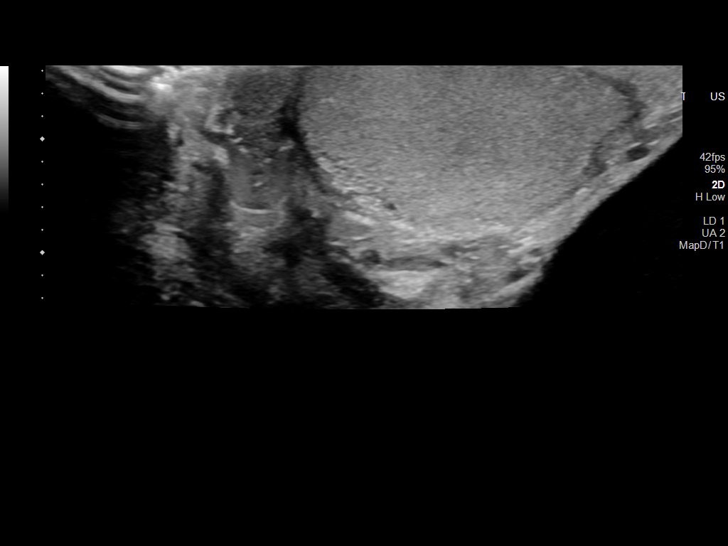
[im 13/51]
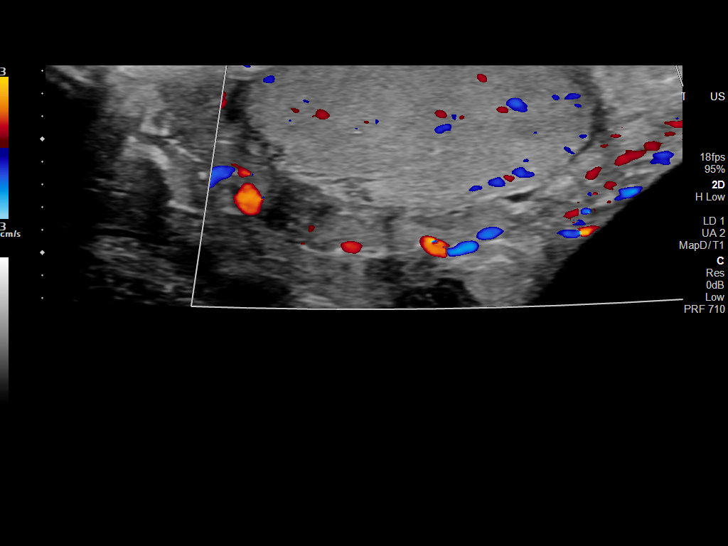
[im 17/51]
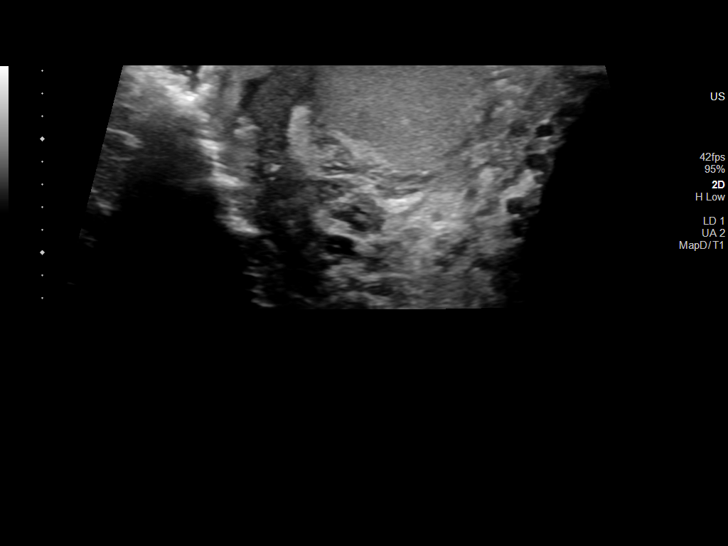
[im 19/51]
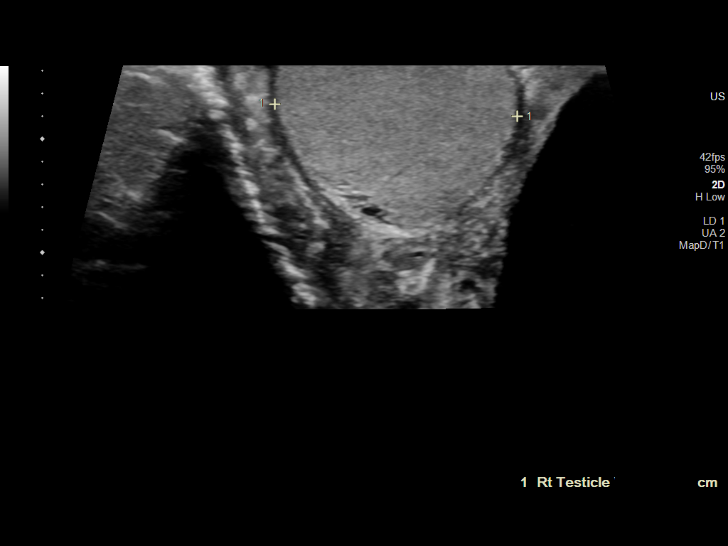
[im 23/51]
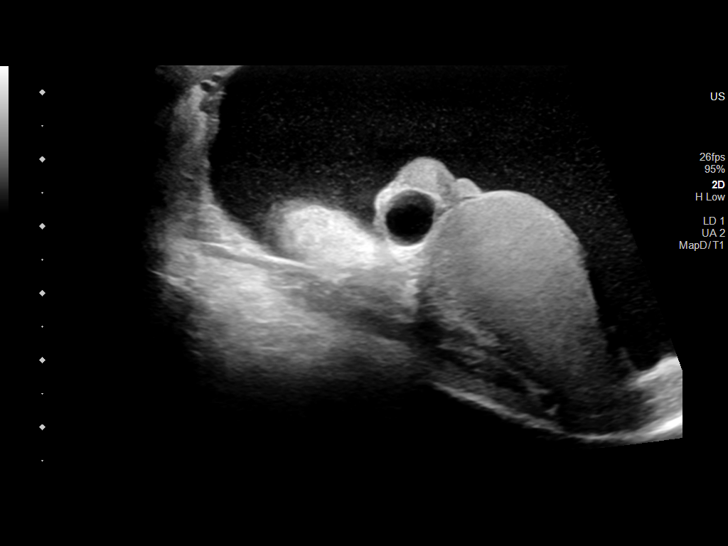
[im 28/51]
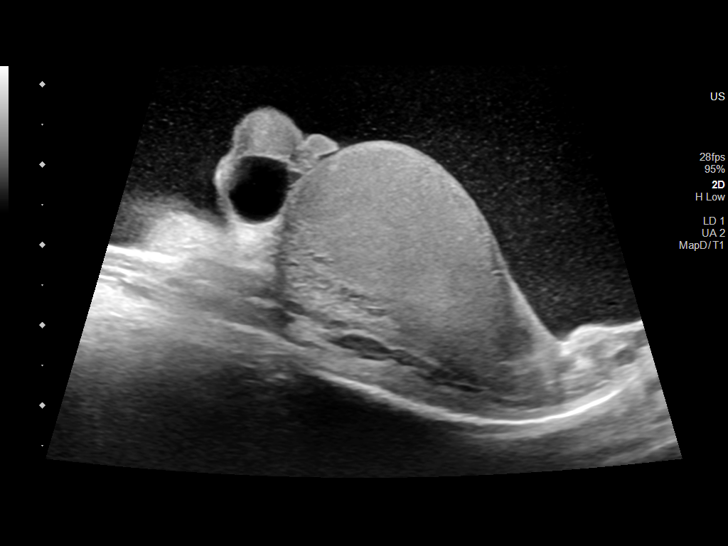
[im 32/51]
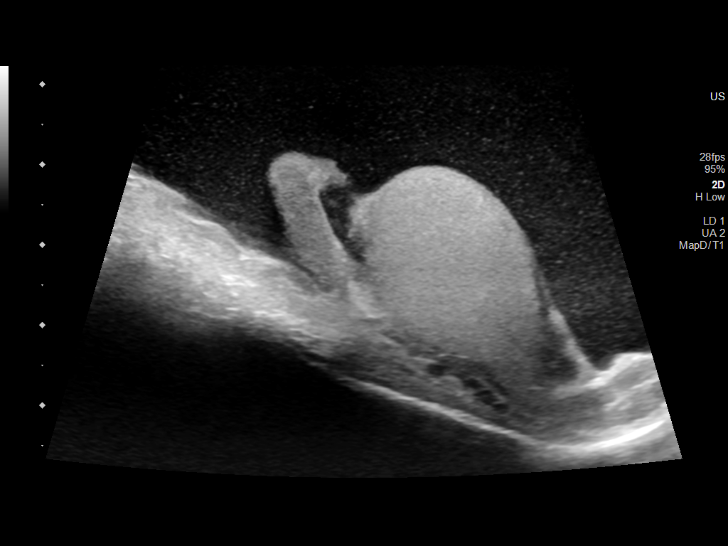
[im 34/51]
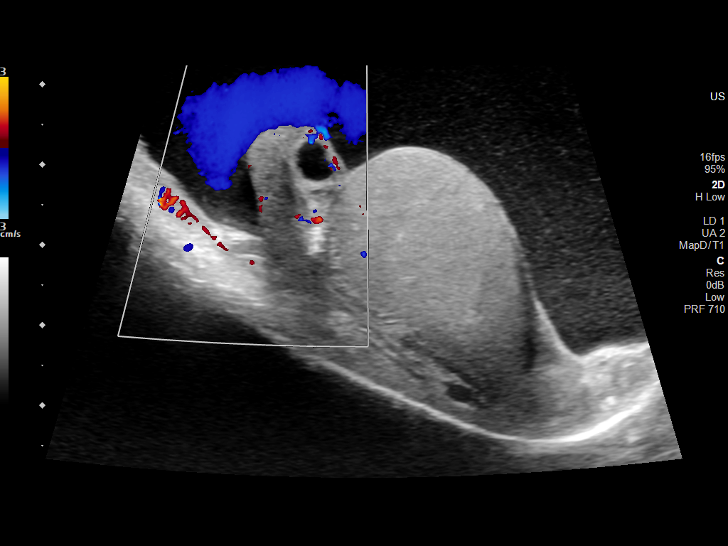
[im 38/51]
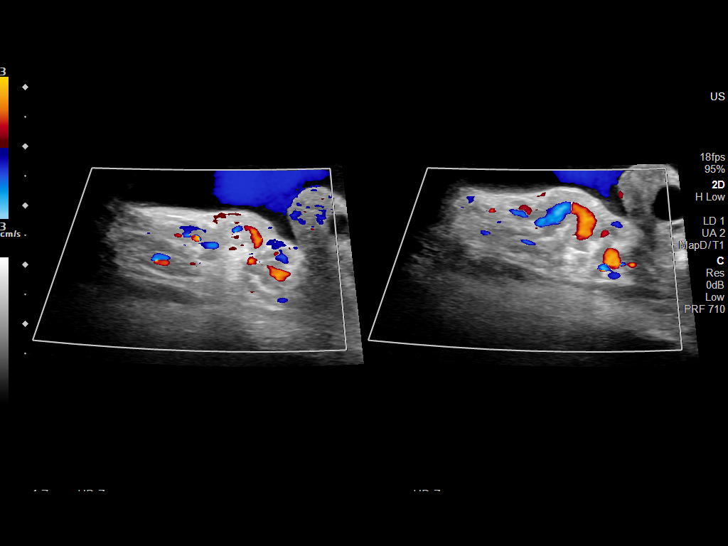
[im 42/51]
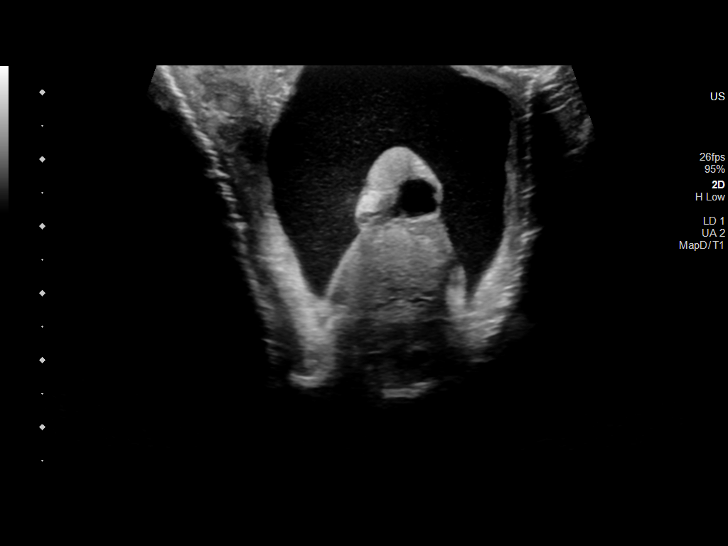
[im 46/51]
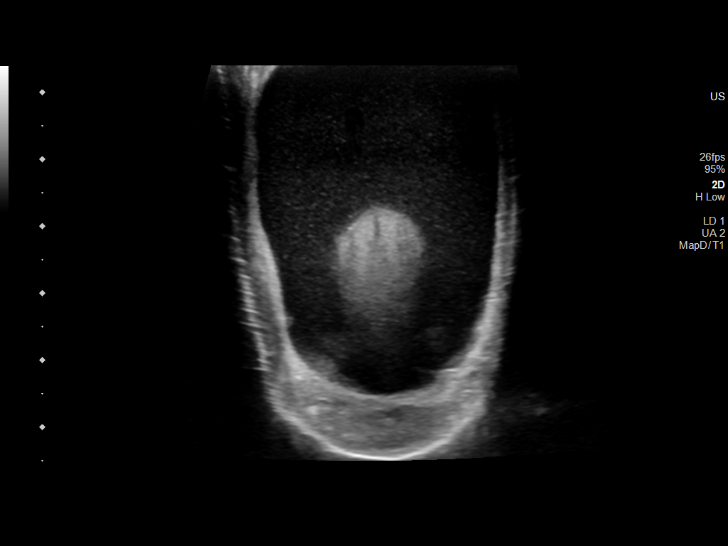
[im 51/51]
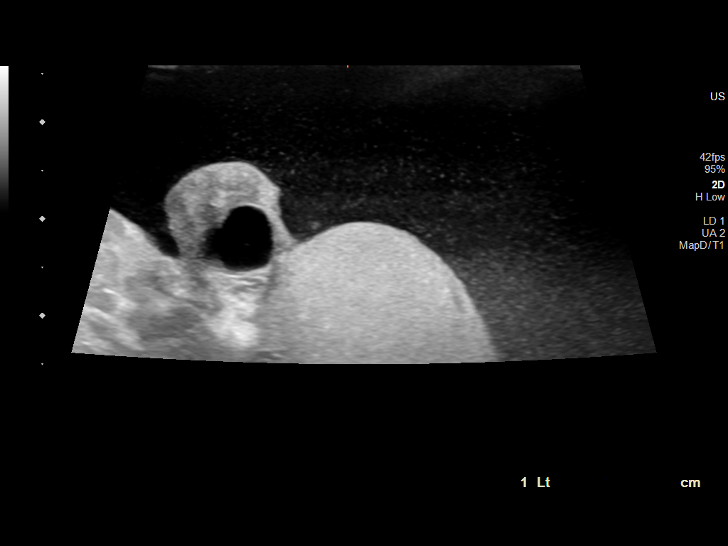

[14 of 25 positions shown; findings below may reference images not displayed]

FINDINGS: Right testicle

Measurements: 3.3 cm x 1.6 cm x 2.1 cm. No mass or microlithiasis
visualized.

Left testicle

Measurements: 3.3 cm x 2.3 cm x 2.2 cm. No mass or microlithiasis
visualized.

Right epididymis:  Normal in size and appearance.

Left epididymis: A 0.7 cm x 0.7 cm x 1.4 cm left epididymal head
cyst is seen.

Hydrocele:  A large left-sided hydrocele is noted.

Varicocele:  None visualized.

Pulsed Doppler interrogation of both testes demonstrates normal low
resistance arterial and venous waveforms bilaterally.
IMPRESSION: 1. Large left-sided hydrocele.
2. Small left epididymal head cyst.
3. Small bilateral testicular flow.

## 2023-08-23 IMAGING — CT CT ABD-PELV W/ CM
2 of 5 series · 17 of 46 positions shown, 19 images · IV contrast (omnipaque)
Comparison: 10/18/2013

CLINICAL DATA: Abdominal pain, nausea, vomiting

EXAM:
CT ABDOMEN AND PELVIS WITH CONTRAST
TECHNIQUE: Multidetector CT imaging of the abdomen and pelvis was performed
using the standard protocol following bolus administration of
intravenous contrast.
CONTRAST:  100mL OMNIPAQUE IOHEXOL 300 MG/ML  SOLN

[Series 2: abd pel w · axial · 0.69mm/px · z∈[-591,-216]mm · 14 of 85 slices shown, 16 images]
[im 5/85  soft-tissue]
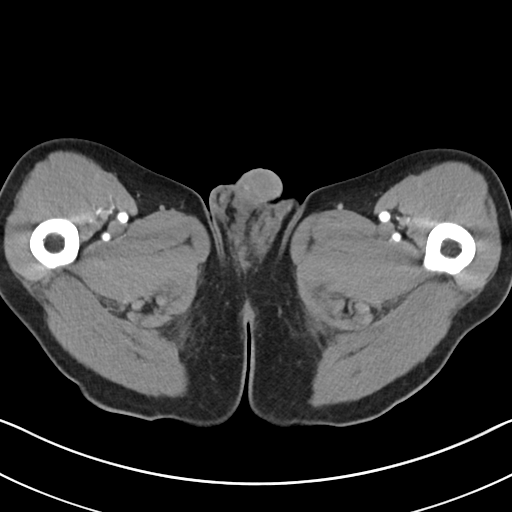
[im 5/85  bone]
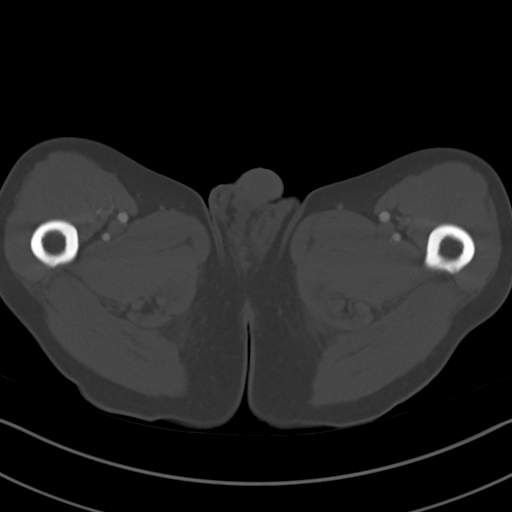
[im 9/85  soft-tissue]
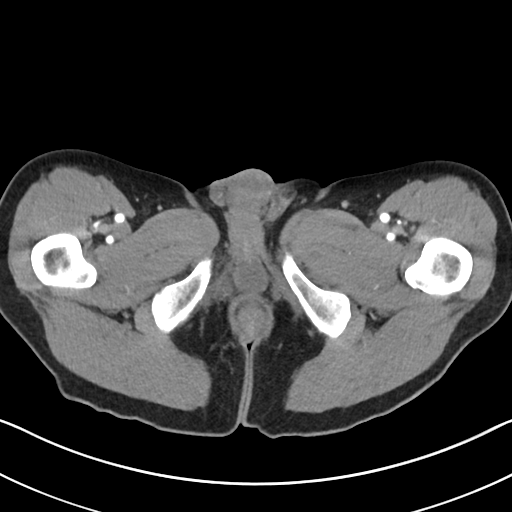
[im 18/85  soft-tissue]
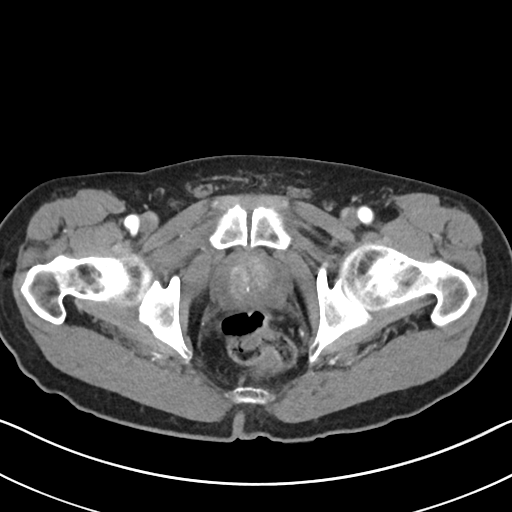
[im 23/85  soft-tissue]
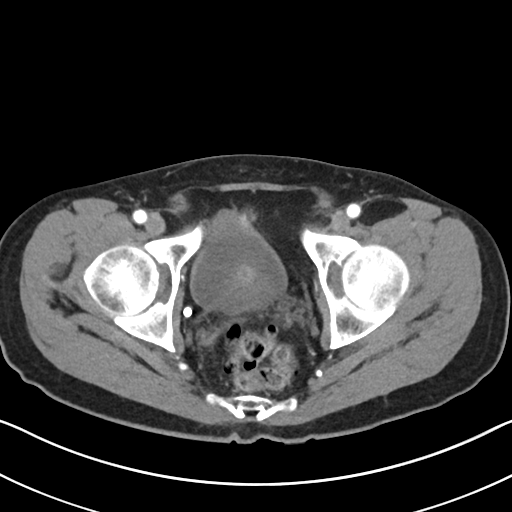
[im 27/85  soft-tissue]
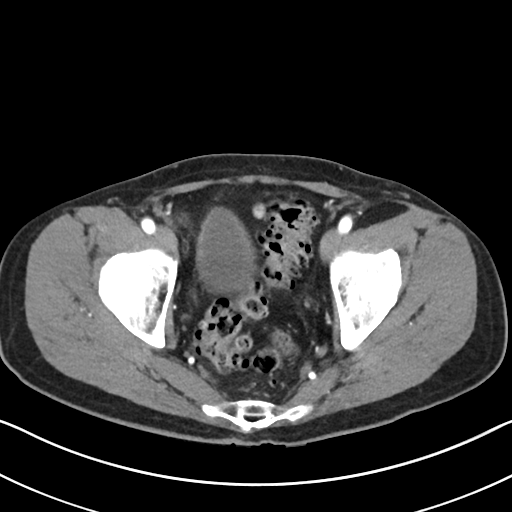
[im 36/85  soft-tissue]
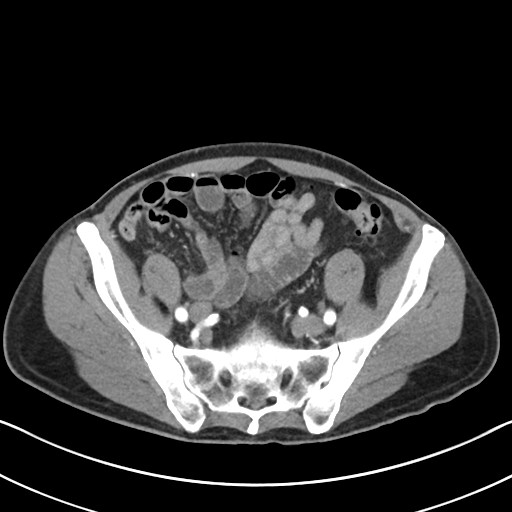
[im 40/85  soft-tissue]
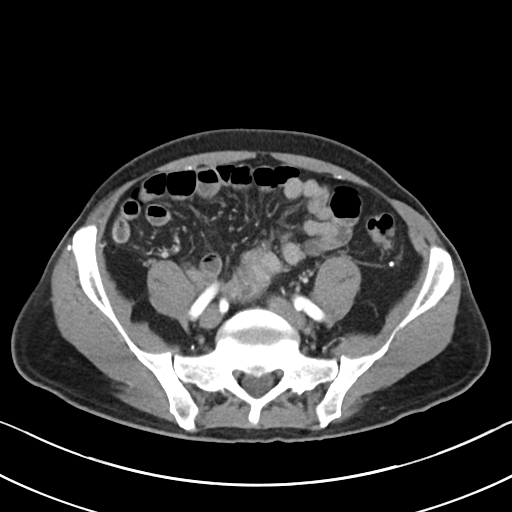
[im 45/85  soft-tissue]
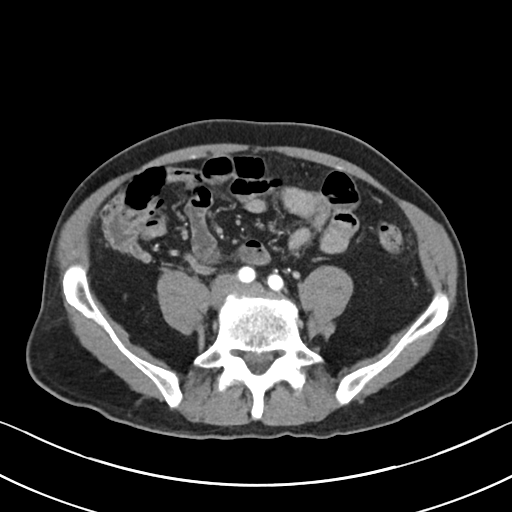
[im 49/85  soft-tissue]
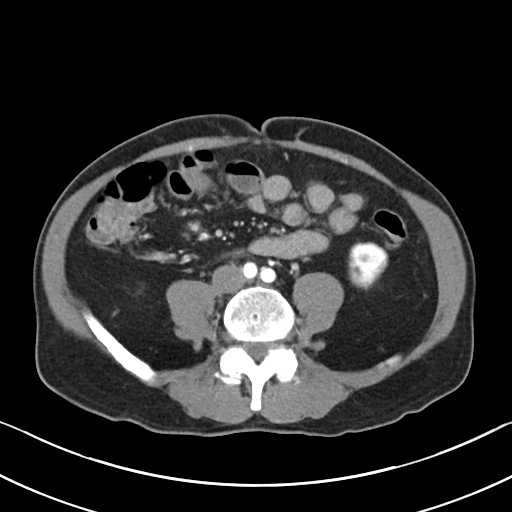
[im 49/85  bone]
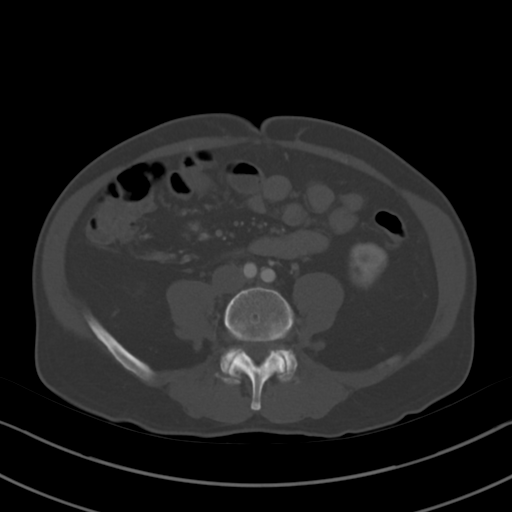
[im 58/85  soft-tissue]
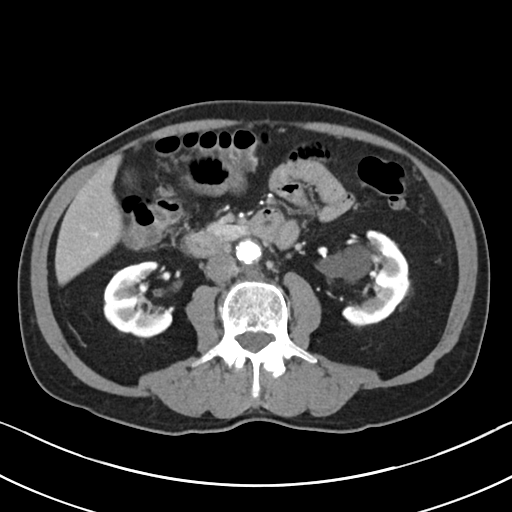
[im 62/85  soft-tissue]
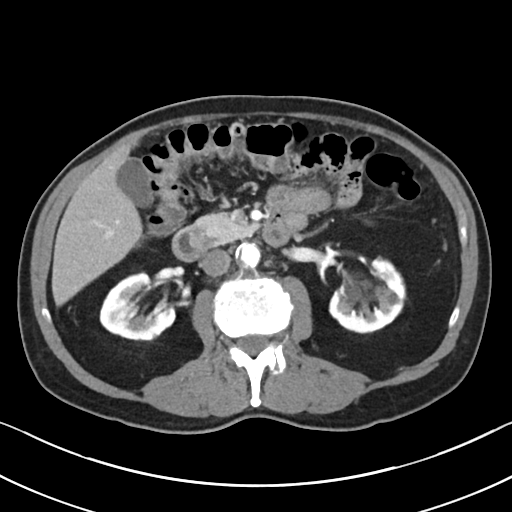
[im 67/85  soft-tissue]
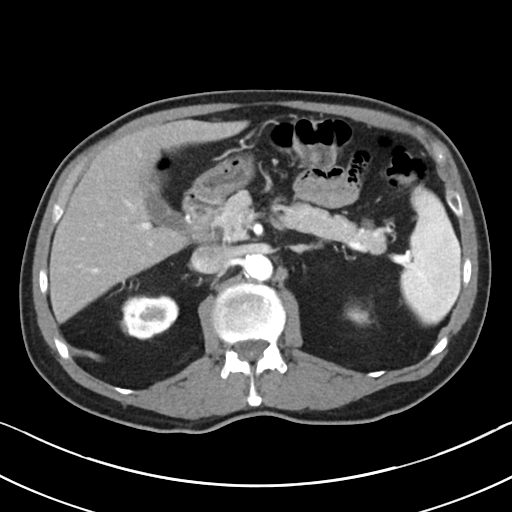
[im 76/85  soft-tissue]
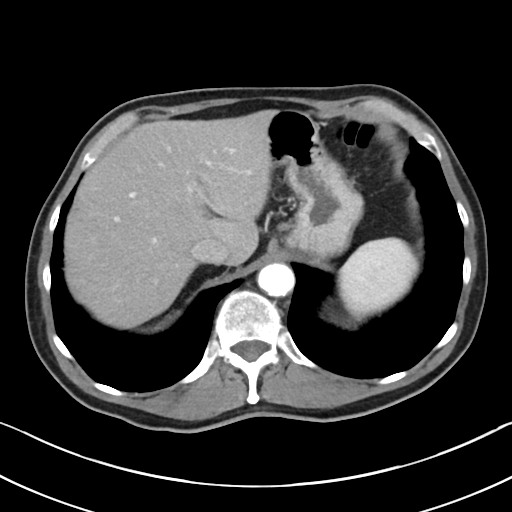
[im 80/85  soft-tissue]
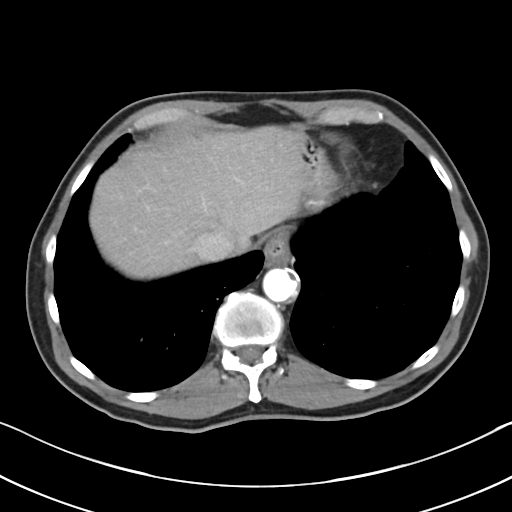

[Series 5: coronal · coronal · 0.73mm/px · 3 of 84 slices shown]
[im 28/84  soft-tissue]
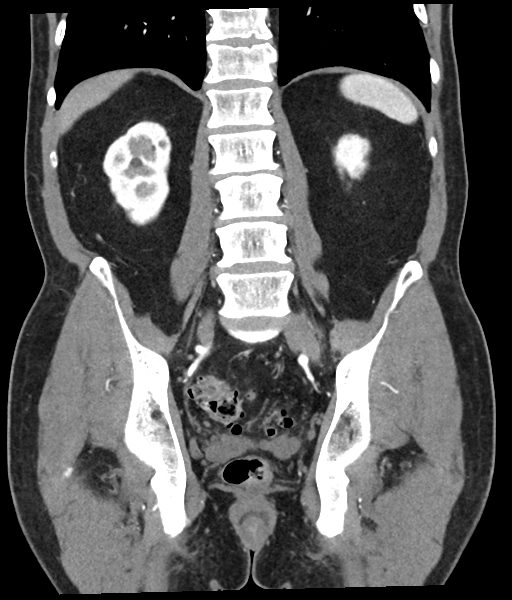
[im 37/84  soft-tissue]
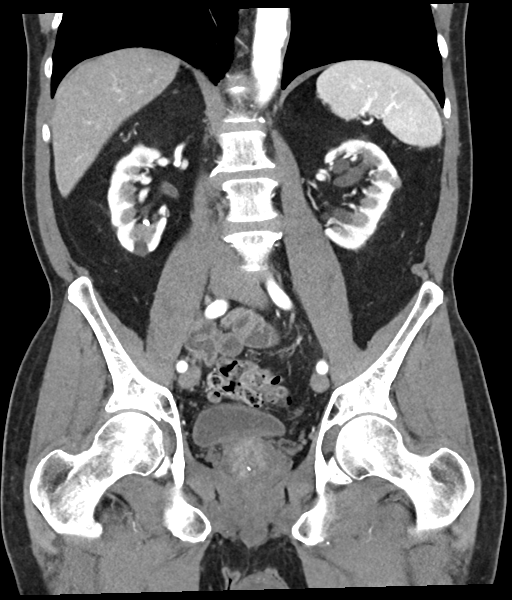
[im 47/84  soft-tissue]
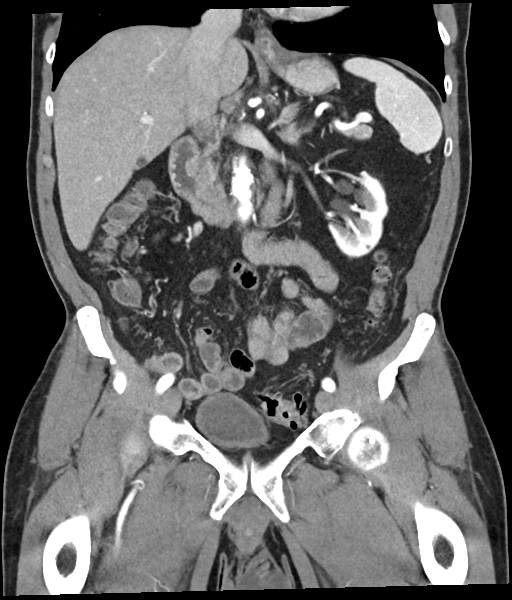

[17 of 46 positions shown; findings below may reference images not displayed]

FINDINGS: Lower chest: Lung bases are clear. No effusions. Heart is normal
size.

Hepatobiliary: 2.5 cm cyst in the right hepatic lobe. 12 mm cyst
posteriorly in the right hepatic lobe. Subcentimeter cyst in the
right hepatic inferior tip. No suspicious focal hepatic abnormality.
Gallbladder unremarkable.

Pancreas: No focal abnormality or ductal dilatation.

Spleen: No focal abnormality.  Normal size.

Adrenals/Urinary Tract: Left parapelvic cysts. No hydronephrosis.
Adrenal glands and urinary bladder unremarkable.

Stomach/Bowel: Extensive sigmoid diverticulosis. No active
diverticulitis. Appendix is normal. No bowel obstruction.

Vascular/Lymphatic: Aortic atherosclerosis. No evidence of aneurysm
or adenopathy.

Reproductive: Prominent prostate

Other: No free fluid or free air.

Musculoskeletal: No acute bony abnormality.
IMPRESSION: No acute findings.

Sigmoid diverticulosis.  No active diverticulitis.

Hepatic and renal cysts.

Aortic atherosclerosis.

Prostate enlargement.

## 2023-10-18 DIAGNOSIS — K080 Exfoliation of teeth due to systemic causes: Secondary | ICD-10-CM | POA: Diagnosis not present

## 2023-11-11 DIAGNOSIS — K080 Exfoliation of teeth due to systemic causes: Secondary | ICD-10-CM | POA: Diagnosis not present

## 2023-11-16 DIAGNOSIS — D485 Neoplasm of uncertain behavior of skin: Secondary | ICD-10-CM | POA: Diagnosis not present

## 2023-11-16 DIAGNOSIS — L821 Other seborrheic keratosis: Secondary | ICD-10-CM | POA: Diagnosis not present

## 2023-11-16 DIAGNOSIS — D225 Melanocytic nevi of trunk: Secondary | ICD-10-CM | POA: Diagnosis not present

## 2023-11-16 DIAGNOSIS — Z08 Encounter for follow-up examination after completed treatment for malignant neoplasm: Secondary | ICD-10-CM | POA: Diagnosis not present

## 2023-11-16 DIAGNOSIS — L814 Other melanin hyperpigmentation: Secondary | ICD-10-CM | POA: Diagnosis not present

## 2023-11-16 DIAGNOSIS — C44629 Squamous cell carcinoma of skin of left upper limb, including shoulder: Secondary | ICD-10-CM | POA: Diagnosis not present

## 2023-11-21 ENCOUNTER — Encounter: Payer: Self-pay | Admitting: Family Medicine

## 2023-11-21 ENCOUNTER — Ambulatory Visit (INDEPENDENT_AMBULATORY_CARE_PROVIDER_SITE_OTHER): Payer: Medicare Other | Admitting: Family Medicine

## 2023-11-21 VITALS — BP 132/70 | HR 72 | Temp 98.0°F | Ht 67.0 in | Wt 138.2 lb

## 2023-11-21 DIAGNOSIS — I700 Atherosclerosis of aorta: Secondary | ICD-10-CM

## 2023-11-21 DIAGNOSIS — E538 Deficiency of other specified B group vitamins: Secondary | ICD-10-CM

## 2023-11-21 DIAGNOSIS — Z125 Encounter for screening for malignant neoplasm of prostate: Secondary | ICD-10-CM | POA: Diagnosis not present

## 2023-11-21 DIAGNOSIS — G63 Polyneuropathy in diseases classified elsewhere: Secondary | ICD-10-CM

## 2023-11-21 DIAGNOSIS — Z0000 Encounter for general adult medical examination without abnormal findings: Secondary | ICD-10-CM

## 2023-11-21 DIAGNOSIS — E785 Hyperlipidemia, unspecified: Secondary | ICD-10-CM

## 2023-11-21 DIAGNOSIS — I10 Essential (primary) hypertension: Secondary | ICD-10-CM

## 2023-11-21 DIAGNOSIS — N183 Chronic kidney disease, stage 3 unspecified: Secondary | ICD-10-CM

## 2023-11-21 NOTE — Progress Notes (Signed)
 Phone: [phone]   Subjective:  Patient presents today for their annual physical. Chief complaint-noted.   See problem oriented charting- ROS- full  review of systems was completed and negative  Per full ROS sheet completed by patient  The following were reviewed and entered/updated in epic: Past Medical History:  Diagnosis Date   B12 deficiency anemia    Diverticulosis    Family history of breast cancer    Hearing loss in left ear    essentially nonfunctioning cochlear implant   Hearing loss of right ear    Hyperlipidemia    Hypertension    Patient Active Problem List   Diagnosis Date Noted   Monoallelic mutation of PALB2 gene 09/26/2015    Priority: High   Cochlear implant in place 12/21/2013    Priority: High   Aortic atherosclerosis (HCC) 07/28/2021    Priority: Medium    CKD (chronic kidney disease), stage III ([facility]) 11/16/2011    Priority: Medium    Essential hypertension 01/25/2008    Priority: Medium    Hyperlipidemia 05/03/2007    Priority: Medium    Family history of breast cancer     Priority: Low   Allergic rhinitis 10/16/2014    Priority: Low   History of skin cancer- follows with skin surgery center yearly  08/23/2013    Priority: Low   Trigeminal neuralgia 01/19/2012    Priority: Low   NEPHROLITHIASIS, RECURRENT 04/30/2010    Priority: Low   Vitamin B12 deficiency neuropathy (HCC) 11/01/2008    Priority: Low   Past Surgical History:  Procedure Laterality Date   COCHLEAR IMPLANT     left   COLONOSCOPY     KIDNEY STONE SURGERY     nasal polyp removal bilateral     skin cancer removal     2010-dermatology (sees derm regularly)    Family History  Problem Relation Age of Onset   Breast cancer Mother        dx in her 48s   Diabetes Mother    Hypertension Father    Heart disease Father        lieftime smoker   COPD Father    Cancer Maternal Aunt        1 maternal aunt with cancer NOS in her late 64s   Cancer Maternal Uncle        2  maternal uncles with cancer NOS   Cancer Maternal Grandfather        cancer NOS   Hodgkin's lymphoma Cousin     Medications- reviewed and updated Current Outpatient Medications  Medication Sig Dispense Refill   aspirin 81 MG tablet Take 81 mg by mouth daily.     atorvastatin (LIPITOR) 40 MG tablet Take 1 tablet (40 mg total) by mouth daily. 90 tablet 3   Cholecalciferol (VITAMIN D3) 2000 UNITS TABS Take 2,000 Units by mouth daily.     Cyanocobalamin (VITAMIN B-12 PO) Take by mouth.     lisinopril (ZESTRIL) 20 MG tablet Take 1 tablet (20 mg total) by mouth daily. 90 tablet 3   niacin 500 MG CR capsule Take 500 mg by mouth at bedtime.     No current facility-administered medications for this visit.    Allergies-reviewed and updated No Known Allergies  Social History   Social History Narrative   Married (wife Dondra Spry patient of dr. Durene Cal). 3 children -2 twin girls and son, 64 grandchildren      Fully retired- worked for [employer] until age 78  Hobbies: formerly played tennis and runs now mostly grandkids time and yardwork   Objective  Objective:  BP 132/70   Pulse 72   Temp 98 F (36.7 C)   Ht 5\' 7"  (1.702 m)   Wt 138 lb 3.2 oz (62.7 kg)   SpO2 97%   BMI 21.65 kg/m  Gen: NAD, resting comfortably HEENT: Mucous membranes are moist. Oropharynx normal Neck: no thyromegaly CV: RRR no murmurs rubs or gallops Lungs: CTAB no crackles, wheeze, rhonchi Abdomen: soft/nontender/nondistended/normal bowel sounds. No rebound or guarding.  Ext: no edema Skin: warm, dry Neuro: grossly normal, moves all extremities, PERRLA   Assessment and Plan  78 y.o. male presenting for annual physical.  Health Maintenance counseling: 1. Anticipatory guidance: [place] 21st- having some old mercury containing fillings removed, eye exams -yearly- needs to schedule,  avoiding smoking and second hand smoke , limiting  alcohol to 2 beverages per day - very rare, no illicit drugs .   2. Risk factor reduction:  Advised patient of need for regular exercise and diet rich and fruits and vegetables to reduce risk of heart attack and stroke.  Exercise- feels could increase this- goal 150 minutes a week.  Diet/weight management-weight down 5 lbs in last year- still relatively stable around 140 Wt Readings from Last 3 Encounters:  11/21/23 138 lb 3.2 oz (62.7 kg)  11/18/22 143 lb 9.6 oz (65.1 kg)  10/08/22 141 lb (64 kg)  3. Immunizations/screenings/ancillary studies-declines COVID and shingrix  Immunization History  Administered Date(s) Administered   Fluad Quad(high Dose 65+) 06/26/2019, 07/24/2020, 06/04/2021, 06/07/2022   Fluad Trivalent(High Dose 65+) 07/18/2023   H1N1 09/23/2008   Hep A / Hep B 09/23/2008, 01/17/2009, 04/11/2009   Hepatitis A 09/23/2008   Hepatitis B 09/23/2008   Influenza Whole 07/24/2009, 07/21/2010, 07/02/2012   Influenza, High Dose Seasonal PF 07/11/2017, 07/27/2018   Influenza-Unspecified 07/04/2014, 07/06/2015, 07/07/2016   PFIZER(Purple Top)SARS-COV-2 Vaccination 11/04/2019, 11/27/2019, 08/13/2020   Pneumococcal Conjugate-13 08/14/2013   Pneumococcal Polysaccharide-23 06/19/2012   Td 09/20/2001   Tdap 10/16/2014  4. Prostate cancer screening-    monoallelic mutation of PALB2 gene- PSA trend reassuring last year- update PSA baed on mutation . Nocturia probably around 4 am but not always Lab Results  Component Value Date   PSA 2.31 11/18/2022   PSA 1.88 11/16/2021   PSA 1.97 11/03/2020   5. Colon cancer screening - February 2022 negative cologuard- no repeat due to age 53. Skin cancer screening- sees dermatology yearly usually- saw last Wednesday. advised regular sunscreen use.  7. Smoking associated screening (lung cancer screening, AAA screen 65-75, UA)- never smoker 8. STD screening - only active with [facility]  Status of chronic or acute concerns   # Social update-[place] and rest of time at [employer] [facility] [place]  #Hypertension S: Compliant with lisinopril 20 mg BP Readings from Last 3 Encounters:  11/21/23 132/70  11/18/22 120/70  05/17/22 120/82  Assessment / plan: continue current medications as well controlled   #CKD stage III S: GFR has been stable around 50s.  Blood pressure has been controlled, lipids controlled.  Knows to avoid NSAIDs. A/P: hopefully stable- update cmp today. Continue without meds for now     #Hyperlipidemia #aortic atherosclerosis  S: Compliant with atorvastatin 40 mg.  He also prefers to continue aspirin for primary prevention. also takes niacin Lab Results  Component Value Date  CHOL 143 11/18/2022   HDL 40.40 11/18/2022   LDLCALC 77 11/18/2022   LDLDIRECT 65.3 01/29/2010   TRIG 131.0 11/18/2022   CHOLHDL 4 11/18/2022  A/P: aortic atherosclerosis (presumed stable)- LDL goal ideally <70 - very close to ideal goal- continue current medications as long as #s roughtly stable   #Vitamin B12 deficiency/neuropathy S: medication: 1000 mcg daily  prior with folbic in past but no longer covered by insurance.  Neuropathy largely resolved on B complex-rare tingling into both legs- no recent issues A/P: hopefully stable- update b12 today. Continue current meds for now    #very sparing dysphagia- sparing issues with certain foods- offered referral to gastroenterology but he declines for now unless worsens  Recommended follow up: Return in about 1 year (around 11/20/2024) for physical or sooner if needed.Schedule b4 you leave. Future Appointments  Date Time Provider Department Center  12/28/2023 10:40 AM [facility] ANNUAL WELLNESS VISIT 1 [facility]   Lab/Order associations: fasting   ICD-10-CM   1. Preventative health care  Z00.00     2. Essential hypertension  I10     3. Hyperlipidemia, unspecified hyperlipidemia type  E78.5     4. Aortic atherosclerosis (HCC)  I70.0     5. Vitamin B12  deficiency neuropathy (HCC)  E53.8    G63     6. Screening for prostate cancer  Z12.5       No orders of the defined types were placed in this encounter.   Return precautions advised.  Tana Conch, MD

## 2023-11-21 NOTE — Patient Instructions (Addendum)
 Health Maintenance Due  Topic Date Due   Medicare Annual Wellness (AWV)  10/09/2023  You are eligible to schedule your annual wellness visit with our nurse specialist Inetta Fermo.  Please consider scheduling this before you leave today  Exercise prescription- at least 150 minutes a week of exercise  Please stop by lab before you go If you have mychart- we will send your results within 3 business days of [place] receiving them.  If you do not have mychart- we will call you about results within 5 business days of [place] receiving them.  *please also note that you will see labs on mychart as soon as they post. I will later go in and write notes on them- will say "notes from Dr. Durene Cal"   Recommended follow up: Return in about 1 year (around 11/20/2024) for physical or sooner if needed.Schedule b4 you leave.

## 2023-11-22 ENCOUNTER — Encounter: Payer: Self-pay | Admitting: Family Medicine

## 2023-11-22 LAB — COMPREHENSIVE METABOLIC PANEL
ALT: 14 U/L (ref 0–53)
AST: 16 U/L (ref 0–37)
Albumin: 4.3 g/dL (ref 3.5–5.2)
Alkaline Phosphatase: 83 U/L (ref 39–117)
BUN: 15 mg/dL (ref 6–23)
CO2: 28 meq/L (ref 19–32)
Calcium: 9.8 mg/dL (ref 8.4–10.5)
Chloride: 104 meq/L (ref 96–112)
Creatinine, Ser: 1.24 mg/dL (ref 0.40–1.50)
GFR: 56.39 mL/min — ABNORMAL LOW (ref >60.00)
Glucose, Bld: 93 mg/dL (ref 70–99)
Potassium: 4.8 meq/L (ref 3.5–5.1)
Sodium: 140 meq/L (ref 135–145)
Total Bilirubin: 1.2 mg/dL (ref 0.2–1.2)
Total Protein: 6.6 g/dL (ref 6.0–8.3)

## 2023-11-22 LAB — CBC WITH DIFFERENTIAL/PLATELET
Basophils Absolute: 0.1 10*3/uL (ref 0.0–0.1)
Basophils Relative: 1.3 % (ref 0.0–3.0)
Eosinophils Absolute: 0.3 10*3/uL (ref 0.0–0.7)
Eosinophils Relative: 3.8 % (ref 0.0–5.0)
HCT: 45.6 % (ref 39.0–52.0)
Hemoglobin: 14.9 g/dL (ref 13.0–17.0)
Lymphocytes Relative: 23.4 % (ref 12.0–46.0)
Lymphs Abs: 1.9 10*3/uL (ref 0.7–4.0)
MCHC: 32.7 g/dL (ref 30.0–36.0)
MCV: 90.2 fl (ref 78.0–100.0)
Monocytes Absolute: 0.4 10*3/uL (ref 0.1–1.0)
Monocytes Relative: 5.4 % (ref 3.0–12.0)
Neutro Abs: 5.3 10*3/uL (ref 1.4–7.7)
Neutrophils Relative %: 66.1 % (ref 43.0–77.0)
Platelets: 216 10*3/uL (ref 150.0–400.0)
RBC: 5.05 Mil/uL (ref 4.22–5.81)
RDW: 13.3 % (ref 11.5–15.5)
WBC: 8.1 10*3/uL (ref 4.0–10.5)

## 2023-11-22 LAB — LIPID PANEL
Cholesterol: 143 mg/dL (ref 0–200)
HDL: 50.3 mg/dL (ref >39.00)
LDL Cholesterol: 74 mg/dL (ref 0–99)
NonHDL: 93.1
Total CHOL/HDL Ratio: 3
Triglycerides: 98 mg/dL (ref 0.0–149.0)
VLDL: 19.6 mg/dL (ref 0.0–40.0)

## 2023-11-22 LAB — PSA, MEDICARE: PSA: 2.66 ng/mL (ref 0.10–4.00)

## 2023-11-22 LAB — VITAMIN B12: Vitamin B-12: 982 pg/mL — ABNORMAL HIGH (ref 211–911)

## 2023-12-05 DIAGNOSIS — K080 Exfoliation of teeth due to systemic causes: Secondary | ICD-10-CM | POA: Diagnosis not present

## 2023-12-26 DIAGNOSIS — C44629 Squamous cell carcinoma of skin of left upper limb, including shoulder: Secondary | ICD-10-CM | POA: Diagnosis not present

## 2023-12-27 DIAGNOSIS — K080 Exfoliation of teeth due to systemic causes: Secondary | ICD-10-CM | POA: Diagnosis not present

## 2023-12-28 ENCOUNTER — Ambulatory Visit (INDEPENDENT_AMBULATORY_CARE_PROVIDER_SITE_OTHER): Payer: Medicare Other

## 2023-12-28 VITALS — BP 132/70 | Ht 67.0 in | Wt 142.0 lb

## 2023-12-28 DIAGNOSIS — Z0000 Encounter for general adult medical examination without abnormal findings: Secondary | ICD-10-CM | POA: Diagnosis not present

## 2023-12-28 NOTE — Progress Notes (Signed)
 Because this visit was a virtual/telehealth visit,  certain criteria was not obtained, such a blood pressure, CBG if applicable, and timed get up and go. Any medications not marked as "taking" were not mentioned during the medication reconciliation part of the visit. Any vitals not documented were not able to be obtained due to this being a telehealth visit or patient was unable to self-report a recent blood pressure reading due to a lack of equipment at home via telehealth. Vitals that have been documented are verbally provided by the patient.   Subjective:   Bryan Carson is a 78 y.o. who presents for a Medicare Wellness preventive visit.  Visit Complete: Virtual I connected with  Bryan Carson on 12/28/23 by a audio enabled telemedicine application and verified that I am speaking with the correct person using two identifiers.  Patient Location: Home  Provider Location: Home Office  I discussed the limitations of evaluation and management by telemedicine. The patient expressed understanding and agreed to proceed.  Vital Signs: Because this visit was a virtual/telehealth visit, some criteria may be missing or patient reported. Any vitals not documented were not able to be obtained and vitals that have been documented are patient reported.  VideoDeclined- This patient declined Librarian, academic. Therefore the visit was completed with audio only.  Persons Participating in Visit: Patient.  AWV Questionnaire: No: Patient Medicare AWV questionnaire was not completed prior to this visit.  Cardiac Risk Factors include: advanced age (>86men, >55 women);hypertension;male gender;dyslipidemia     Objective:    Today's Vitals   12/28/23 1017  BP: 132/70  Weight: 142 lb (64.4 kg)  Height: 5\' 7"  (1.702 m)   Body mass index is 22.24 kg/m.     12/28/2023   10:28 AM 10/08/2022    2:03 PM 07/23/2021    4:12 PM  Advanced Directives  Does Patient Have a Medical Advance  Directive? Yes Yes No  Type of Estate agent of [place];Living will Healthcare Power of [place];Living will   Does patient want to make changes to medical advance directive? No - Patient declined    Copy of Healthcare Power of Attorney in Chart? No - copy requested No - copy requested     Current Medications (verified) Outpatient Encounter Medications as of 12/28/2023  Medication Sig   aspirin 81 MG tablet Take 81 mg by mouth daily.   atorvastatin (LIPITOR) 40 MG tablet Take 1 tablet (40 mg total) by mouth daily.   Cholecalciferol (VITAMIN D3) 2000 UNITS TABS Take 2,000 Units by mouth daily.   Cyanocobalamin (VITAMIN B-12 PO) Take by mouth.   lisinopril (ZESTRIL) 20 MG tablet Take 1 tablet (20 mg total) by mouth daily.   niacin 500 MG CR capsule Take 500 mg by mouth at bedtime.   No facility-administered encounter medications on file as of 12/28/2023.    Allergies (verified) Patient has no known allergies.   History: Past Medical History:  Diagnosis Date   B12 deficiency anemia    Diverticulosis    Family history of breast cancer    Hearing loss in left ear    essentially nonfunctioning cochlear implant   Hearing loss of right ear    Hyperlipidemia    Hypertension    Past Surgical History:  Procedure Laterality Date   COCHLEAR IMPLANT     left   COLONOSCOPY     KIDNEY STONE SURGERY     nasal polyp removal bilateral     skin cancer removal  2010-dermatology (sees derm regularly)   Family History  Problem Relation Age of Onset   Breast cancer Mother        dx in her 34s   Diabetes Mother    Hypertension Father    Heart disease Father        lieftime smoker   COPD Father    Cancer Maternal Aunt        1 maternal aunt with cancer NOS in her late 31s   Cancer Maternal Uncle        2 maternal uncles with cancer NOS   Cancer Maternal Grandfather        cancer NOS   Hodgkin's lymphoma Cousin    Social History   Socioeconomic History    Marital status: Married    Spouse name: Not on file   Number of children: 3   Years of education: Not on file   Highest education level: Not on file  Occupational History   Not on file  Tobacco Use   Smoking status: Never   Smokeless tobacco: Never  Substance and Sexual Activity   Alcohol use: Yes    Alcohol/week: 0.0 standard drinks of alcohol    Comment: 1 a month   Drug use: No   Sexual activity: Yes  Other Topics Concern   Not on file  Social History Narrative   Married (wife Bryan Carson patient of dr. Durene Cal). 3 children -2 twin girls and son, 58 grandchildren      Fully retired- worked for [employer] until age 48      Hobbies: formerly played tennis and runs now mostly grandkids time and Presenter, broadcasting   Social Drivers of Corporate investment banker Strain: Low Risk  (12/28/2023)   Overall Financial Resource Strain (CARDIA)    Difficulty of Paying Living Expenses: Not hard at all  Food Insecurity: No Food Insecurity (12/28/2023)   Hunger Vital Sign    Worried About Running Out of Food in the Last Year: Never true    Ran Out of Food in the Last Year: Never true  Transportation Needs: No Transportation Needs (12/28/2023)   PRAPARE - Administrator, Civil Service (Medical): No    Lack of Transportation (Non-Medical): No  Physical Activity: Insufficiently Active (12/28/2023)   Exercise Vital Sign    Days of Exercise per Week: 4 days    Minutes of Exercise per Session: 30 min  Stress: No Stress Concern Present (12/28/2023)   [employer] of Occupational Health - Occupational Stress Questionnaire    Feeling of Stress : Not at all  Social Connections: Moderately Integrated (12/28/2023)   Social Connection and Isolation Panel [NHANES]    Frequency of Communication with Friends and Family: Twice a week    Frequency of Social Gatherings with Friends and Family: Three times a week    Attends Religious Services: More than 4 times per year    Active Member of Clubs or Organizations: No     Attends Banker Meetings: Never    Marital Status: Married    Tobacco Counseling Counseling given: Not Answered    Clinical Intake:  Pre-visit preparation completed: Yes        BMI - recorded: 22.24 Nutritional Status: BMI of 19-24  Normal Nutritional Risks: None Diabetes: No  No results found for: "HGBA1C"   How often do you need to have someone help you when you read instructions, pamphlets, or other written materials from your doctor or pharmacy?: 1 - Never What  is the last grade level you completed in school?: 4 year college  Interpreter Needed?: No  Information entered by :: Estell Harpin   Activities of Daily Living     12/28/2023   10:26 AM  In your present state of health, do you have any difficulty performing the following activities:  Hearing? 1  Comment has hearing aids  Vision? 0  Difficulty concentrating or making decisions? 0  Walking or climbing stairs? 0  Dressing or bathing? 1  Doing errands, shopping? 0  Preparing Food and eating ? N  Using the Toilet? N  In the past six months, have you accidently leaked urine? N  Do you have problems with loss of bowel control? N  Managing your Medications? N  Managing your Finances? N  Housekeeping or managing your Housekeeping? N    Patient Care Team: Shelva Majestic, MD as PCP - General (Family Medicine) Wynona Canes, MD as Referring Physician (Specialist) Genia Del Daisy Blossom, MD as Consulting Physician (Ophthalmology) Newman Pies, MD as Consulting Physician (Otolaryngology)  Indicate any recent Medical Services you may have received from other than [facility] providers in the past year (date may be approximate).     Assessment:   This is a routine wellness examination for Amyr.  Hearing/Vision screen Hearing Screening - Comments:: Has hearing aids Vision Screening - Comments:: Patient wears contact   Goals Addressed             This Visit's Progress    Patient Stated        To pay his taxes and stay healthy        Depression Screen     12/28/2023   10:29 AM 11/21/2023    1:55 PM 11/18/2022   12:47 PM 10/08/2022    1:55 PM 05/17/2022   11:34 AM 05/04/2021    9:38 AM 11/03/2020   10:40 AM  PHQ 2/9 Scores  PHQ - 2 Score 0 0 0 0 0 0 0  PHQ- 9 Score 0 0 0  0      Fall Risk     12/28/2023   11:26 AM 11/21/2023    1:53 PM 11/18/2022   12:46 PM 10/08/2022    2:04 PM 05/17/2022   11:33 AM  Fall Risk   Falls in the past year? 0 0 1 0 0  Number falls in past yr: 0 0 0 0 0  Injury with Fall? 0 0 0 0 0  Risk for fall due to : No Fall Risks No Fall Risks History of fall(s) Impaired vision No Fall Risks  Follow up Falls prevention discussed;Falls evaluation completed Falls evaluation completed Falls evaluation completed Falls prevention discussed Falls evaluation completed    MEDICARE RISK AT HOME:  Medicare Risk at Home Any stairs in or around the home?: Yes If so, are there any without handrails?: No Home free of loose throw rugs in walkways, pet beds, electrical cords, etc?: Yes Adequate lighting in your home to reduce risk of falls?: Yes Life alert?: No Use of a cane, walker or w/c?: No Grab bars in the bathroom?: Yes Shower chair or bench in shower?: Yes Elevated toilet seat or a handicapped toilet?: No  TIMED UP AND GO:  Was the test performed?  No  Cognitive Function: 6CIT completed        12/28/2023   10:20 AM  6CIT Screen  What Year? 0 points  What month? 0 points  What time? 0 points  Count back from 20 0  points  Months in reverse 0 points  Repeat phrase 0 points  Total Score 0 points    Immunizations Immunization History  Administered Date(s) Administered   Fluad Quad(high Dose 65+) 06/26/2019, 07/24/2020, 06/04/2021, 06/07/2022   Fluad Trivalent(High Dose 65+) 07/18/2023   H1N1 09/23/2008   Hep A / Hep B 09/23/2008, 01/17/2009, 04/11/2009   Hepatitis A 09/23/2008   Hepatitis B 09/23/2008   Influenza Whole 07/24/2009,  07/21/2010, 07/02/2012   Influenza, High Dose Seasonal PF 07/11/2017, 07/27/2018   Influenza-Unspecified 07/04/2014, 07/06/2015, 07/07/2016   PFIZER(Purple Top)SARS-COV-2 Vaccination 11/04/2019, 11/27/2019, 08/13/2020   Pneumococcal Conjugate-13 08/14/2013   Pneumococcal Polysaccharide-23 06/19/2012   Td 09/20/2001   Tdap 10/16/2014    Screening Tests Health Maintenance  Topic Date Due   COVID-19 Vaccine (4 - 2024-25 season) 05/22/2023   Zoster Vaccines- Shingrix (1 of 2) 02/21/2024 (Originally 02/25/1997)   INFLUENZA VACCINE  04/20/2024   DTaP/Tdap/Td (3 - Td or Tdap) 10/16/2024   Medicare Annual Wellness (AWV)  12/27/2024   Pneumonia Vaccine 79+ Years old  Completed   Hepatitis C Screening  Completed   HPV VACCINES  Aged Out   Colonoscopy  Discontinued   Fecal DNA (Cologuard)  Discontinued    Health Maintenance  Health Maintenance Due  Topic Date Due   COVID-19 Vaccine (4 - 2024-25 season) 05/22/2023   Health Maintenance Items Addressed:   Additional Screening:  Vision Screening: Recommended annual ophthalmology exams for early detection of glaucoma and other disorders of the eye.  Dental Screening: Recommended annual dental exams for proper oral hygiene  Community Resource Referral / Chronic Care Management: CRR required this visit?  No   CCM required this visit?  No     Plan:     I have personally reviewed and noted the following in the patient's chart:   Medical and social history Use of alcohol, tobacco or illicit drugs  Current medications and supplements including opioid prescriptions. Patient is not currently taking opioid prescriptions. Functional ability and status Nutritional status Physical activity Advanced directives List of other physicians Hospitalizations, surgeries, and ER visits in previous 12 months Vitals Screenings to include cognitive, depression, and falls Referrals and appointments  In addition, I have reviewed and discussed  with patient certain preventive protocols, quality metrics, and best practice recommendations. A written personalized care plan for preventive services as well as general preventive health recommendations were provided to patient.     Rudi Heap, [place]   12/28/2023   After Visit Summary: (MyChart) Due to this being a telephonic visit, the after visit summary with patients personalized plan was offered to patient via MyChart   Notes: Nothing significant to report at this time.

## 2023-12-28 NOTE — Patient Instructions (Signed)
 [name] , Thank you for taking time to come for your Medicare Wellness Visit. I appreciate your ongoing commitment to your health goals. Please review the following plan we discussed and let me know if I can assist you in the future.   Referrals/Orders/Follow-Ups/Clinician Recommendations: Follow up in 1 year for next AWV.  This is a list of the screening recommended for you and due dates:  Health Maintenance  Topic Date Due   COVID-19 Vaccine (4 - 2024-25 season) 05/22/2023   Zoster (Shingles) Vaccine (1 of 2) 02/21/2024*   Flu Shot  04/20/2024   DTaP/Tdap/Td vaccine (3 - Td or Tdap) 10/16/2024   Medicare Annual Wellness Visit  12/27/2024   Pneumonia Vaccine  Completed   Hepatitis C Screening  Completed   HPV Vaccine  Aged Out   Colon Cancer Screening  Discontinued   Cologuard (Stool DNA test)  Discontinued  *Topic was postponed. The date shown is not the original due date.    Advanced directives: (Copy Requested) Please bring a copy of your health care power of attorney and living will to the office to be added to your chart at your convenience. You can mail to [facility] [address] [place], [place] [address] or email to [email or web address]  Next Medicare Annual Wellness Visit scheduled for next year: Yes

## 2024-02-01 DIAGNOSIS — K080 Exfoliation of teeth due to systemic causes: Secondary | ICD-10-CM | POA: Diagnosis not present

## 2024-03-06 DIAGNOSIS — H5213 Myopia, bilateral: Secondary | ICD-10-CM | POA: Diagnosis not present

## 2024-06-12 DIAGNOSIS — H903 Sensorineural hearing loss, bilateral: Secondary | ICD-10-CM | POA: Diagnosis not present

## 2024-08-08 ENCOUNTER — Other Ambulatory Visit: Payer: Self-pay | Admitting: Family Medicine

## 2024-11-21 ENCOUNTER — Encounter: Admitting: Family Medicine

## 2025-01-02 ENCOUNTER — Ambulatory Visit
